# Patient Record
Sex: Female | Born: 1970
Health system: Southern US, Community
[De-identification: ages and names within clinical notes are randomized; demographics above are authoritative.]

## PROBLEM LIST (undated history)

## (undated) DIAGNOSIS — I10 Essential (primary) hypertension: Secondary | ICD-10-CM

## (undated) DIAGNOSIS — D179 Benign lipomatous neoplasm, unspecified: Secondary | ICD-10-CM

## (undated) DIAGNOSIS — B999 Unspecified infectious disease: Secondary | ICD-10-CM

## (undated) DIAGNOSIS — E785 Hyperlipidemia, unspecified: Secondary | ICD-10-CM

## (undated) DIAGNOSIS — T7840XA Allergy, unspecified, initial encounter: Secondary | ICD-10-CM

## (undated) DIAGNOSIS — O009 Unspecified ectopic pregnancy without intrauterine pregnancy: Secondary | ICD-10-CM

## (undated) DIAGNOSIS — D219 Benign neoplasm of connective and other soft tissue, unspecified: Secondary | ICD-10-CM

## (undated) HISTORY — DX: Allergy, unspecified, initial encounter: T78.40XA

## (undated) HISTORY — PX: ABDOMINAL HYSTERECTOMY: SHX81

## (undated) HISTORY — PX: OTHER SURGICAL HISTORY: SHX169

## (undated) HISTORY — DX: Hyperlipidemia, unspecified: E78.5

## (undated) HISTORY — DX: Benign lipomatous neoplasm, unspecified: D17.9

## (undated) HISTORY — PX: ECTOPIC PREGNANCY SURGERY: SHX613

## (undated) HISTORY — PX: ENDOMETRIAL ABLATION: SHX621

## (undated) HISTORY — PX: GYNECOLOGIC CRYOSURGERY: SHX857

## (undated) SURGERY — Surgical Case
Anesthesia: *Unknown

---

## 1988-10-08 HISTORY — PX: ECTOPIC PREGNANCY SURGERY: SHX613

## 1998-09-15 ENCOUNTER — Other Ambulatory Visit: Admission: RE | Admit: 1998-09-15 | Discharge: 1998-09-15 | Payer: Self-pay | Admitting: Obstetrics

## 1998-12-15 ENCOUNTER — Ambulatory Visit (HOSPITAL_COMMUNITY): Admission: RE | Admit: 1998-12-15 | Discharge: 1998-12-15 | Payer: Self-pay | Admitting: Obstetrics

## 1999-04-03 ENCOUNTER — Inpatient Hospital Stay (HOSPITAL_COMMUNITY): Admission: AD | Admit: 1999-04-03 | Discharge: 1999-04-05 | Payer: Self-pay | Admitting: Obstetrics

## 1999-05-24 ENCOUNTER — Ambulatory Visit (HOSPITAL_COMMUNITY): Admission: RE | Admit: 1999-05-24 | Discharge: 1999-05-24 | Payer: Self-pay | Admitting: Obstetrics

## 2002-03-15 ENCOUNTER — Emergency Department (HOSPITAL_COMMUNITY): Admission: EM | Admit: 2002-03-15 | Discharge: 2002-03-16 | Payer: Self-pay | Admitting: *Deleted

## 2002-04-07 ENCOUNTER — Ambulatory Visit (HOSPITAL_BASED_OUTPATIENT_CLINIC_OR_DEPARTMENT_OTHER): Admission: RE | Admit: 2002-04-07 | Discharge: 2002-04-07 | Payer: Self-pay | Admitting: Plastic Surgery

## 2004-07-02 ENCOUNTER — Emergency Department (HOSPITAL_COMMUNITY): Admission: EM | Admit: 2004-07-02 | Discharge: 2004-07-02 | Payer: Self-pay | Admitting: Emergency Medicine

## 2004-07-03 ENCOUNTER — Emergency Department (HOSPITAL_COMMUNITY): Admission: EM | Admit: 2004-07-03 | Discharge: 2004-07-04 | Payer: Self-pay | Admitting: Emergency Medicine

## 2005-01-03 ENCOUNTER — Ambulatory Visit (HOSPITAL_COMMUNITY): Admission: RE | Admit: 2005-01-03 | Discharge: 2005-01-03 | Payer: Self-pay | Admitting: Obstetrics

## 2005-01-11 ENCOUNTER — Encounter: Admission: RE | Admit: 2005-01-11 | Discharge: 2005-01-11 | Payer: Self-pay | Admitting: Obstetrics

## 2005-01-18 ENCOUNTER — Ambulatory Visit: Payer: Self-pay

## 2005-01-27 ENCOUNTER — Encounter: Admission: RE | Admit: 2005-01-27 | Discharge: 2005-01-27 | Payer: Self-pay | Admitting: Neurology

## 2006-04-23 ENCOUNTER — Emergency Department (HOSPITAL_COMMUNITY): Admission: EM | Admit: 2006-04-23 | Discharge: 2006-04-23 | Payer: Self-pay | Admitting: Emergency Medicine

## 2006-07-25 ENCOUNTER — Encounter: Admission: RE | Admit: 2006-07-25 | Discharge: 2006-07-25 | Payer: Self-pay | Admitting: Family Medicine

## 2007-08-15 ENCOUNTER — Encounter: Admission: RE | Admit: 2007-08-15 | Discharge: 2007-08-15 | Payer: Self-pay | Admitting: Neurosurgery

## 2008-02-24 ENCOUNTER — Other Ambulatory Visit: Admission: RE | Admit: 2008-02-24 | Discharge: 2008-02-24 | Payer: Self-pay | Admitting: Family Medicine

## 2008-03-19 ENCOUNTER — Ambulatory Visit (HOSPITAL_BASED_OUTPATIENT_CLINIC_OR_DEPARTMENT_OTHER): Admission: RE | Admit: 2008-03-19 | Discharge: 2008-03-19 | Payer: Self-pay | Admitting: Family Medicine

## 2008-03-27 ENCOUNTER — Ambulatory Visit: Payer: Self-pay | Admitting: Internal Medicine

## 2008-07-02 ENCOUNTER — Encounter: Admission: RE | Admit: 2008-07-02 | Discharge: 2008-07-02 | Payer: Self-pay | Admitting: Family Medicine

## 2008-08-05 ENCOUNTER — Encounter: Admission: RE | Admit: 2008-08-05 | Discharge: 2008-08-05 | Payer: Self-pay | Admitting: Neurosurgery

## 2008-08-27 ENCOUNTER — Ambulatory Visit (HOSPITAL_COMMUNITY): Admission: RE | Admit: 2008-08-27 | Discharge: 2008-08-27 | Payer: Self-pay | Admitting: Obstetrics & Gynecology

## 2008-08-27 ENCOUNTER — Encounter: Payer: Self-pay | Admitting: Obstetrics & Gynecology

## 2008-10-04 ENCOUNTER — Emergency Department (HOSPITAL_COMMUNITY): Admission: EM | Admit: 2008-10-04 | Discharge: 2008-10-04 | Payer: Self-pay | Admitting: Emergency Medicine

## 2009-01-03 ENCOUNTER — Emergency Department (HOSPITAL_COMMUNITY): Admission: EM | Admit: 2009-01-03 | Discharge: 2009-01-03 | Payer: Self-pay | Admitting: Emergency Medicine

## 2009-08-10 ENCOUNTER — Emergency Department (HOSPITAL_COMMUNITY): Admission: EM | Admit: 2009-08-10 | Discharge: 2009-08-10 | Payer: Self-pay | Admitting: Emergency Medicine

## 2009-10-21 ENCOUNTER — Emergency Department (HOSPITAL_COMMUNITY): Admission: EM | Admit: 2009-10-21 | Discharge: 2009-10-21 | Payer: Self-pay | Admitting: Emergency Medicine

## 2009-11-30 ENCOUNTER — Encounter: Admission: RE | Admit: 2009-11-30 | Discharge: 2010-01-16 | Payer: Self-pay | Admitting: Family Medicine

## 2010-07-05 ENCOUNTER — Emergency Department (HOSPITAL_COMMUNITY): Admission: EM | Admit: 2010-07-05 | Discharge: 2010-07-05 | Payer: Self-pay | Admitting: Emergency Medicine

## 2010-10-29 ENCOUNTER — Encounter: Payer: Self-pay | Admitting: Neurology

## 2010-12-06 ENCOUNTER — Emergency Department (HOSPITAL_COMMUNITY)
Admission: EM | Admit: 2010-12-06 | Discharge: 2010-12-06 | Disposition: A | Discharge status: Home / Self Care | Payer: Medicaid Other | Attending: Emergency Medicine | Admitting: Emergency Medicine

## 2010-12-06 ENCOUNTER — Emergency Department (HOSPITAL_COMMUNITY): Payer: Medicaid Other

## 2010-12-06 DIAGNOSIS — R509 Fever, unspecified: Secondary | ICD-10-CM | POA: Insufficient documentation

## 2010-12-06 DIAGNOSIS — R0602 Shortness of breath: Secondary | ICD-10-CM | POA: Insufficient documentation

## 2010-12-06 DIAGNOSIS — J329 Chronic sinusitis, unspecified: Secondary | ICD-10-CM | POA: Insufficient documentation

## 2010-12-06 DIAGNOSIS — R059 Cough, unspecified: Secondary | ICD-10-CM | POA: Insufficient documentation

## 2010-12-06 DIAGNOSIS — R05 Cough: Secondary | ICD-10-CM | POA: Insufficient documentation

## 2010-12-06 DIAGNOSIS — I10 Essential (primary) hypertension: Secondary | ICD-10-CM | POA: Insufficient documentation

## 2010-12-06 LAB — CBC
Hemoglobin: 12.1 g/dL (ref 12.0–15.0)
MCH: 31.8 pg (ref 26.0–34.0)
MCV: 97.9 fL (ref 78.0–100.0)
RBC: 3.8 MIL/uL — ABNORMAL LOW (ref 3.87–5.11)

## 2010-12-06 LAB — DIFFERENTIAL
Lymphocytes Relative: 29 % (ref 12–46)
Lymphs Abs: 2.8 10*3/uL (ref 0.7–4.0)
Monocytes Relative: 8 % (ref 3–12)
Neutro Abs: 5.9 10*3/uL (ref 1.7–7.7)
Neutrophils Relative %: 61 % (ref 43–77)

## 2010-12-06 LAB — BASIC METABOLIC PANEL
CO2: 22 mEq/L (ref 19–32)
Chloride: 108 mEq/L (ref 96–112)
Creatinine, Ser: 0.76 mg/dL (ref 0.4–1.2)
GFR calc Af Amer: >60 mL/min (ref >60)

## 2010-12-06 LAB — HEPATIC FUNCTION PANEL
Albumin: 3.6 g/dL (ref 3.5–5.2)
Alkaline Phosphatase: 85 U/L (ref 39–117)
Total Bilirubin: 0.3 mg/dL (ref 0.3–1.2)

## 2010-12-24 LAB — URINALYSIS, ROUTINE W REFLEX MICROSCOPIC
Bilirubin Urine: NEGATIVE
Glucose, UA: NEGATIVE mg/dL
Ketones, ur: NEGATIVE mg/dL
Leukocytes, UA: NEGATIVE
Protein, ur: 100 mg/dL — AB

## 2010-12-24 LAB — POCT PREGNANCY, URINE: Preg Test, Ur: NEGATIVE

## 2010-12-24 LAB — URINE MICROSCOPIC-ADD ON

## 2011-02-06 ENCOUNTER — Ambulatory Visit: Payer: Medicaid Other | Admitting: *Deleted

## 2011-02-20 NOTE — Op Note (Signed)
Stacy Berry, Stacy Berry             ACCOUNT NO.:  [id number]   MEDICAL RECORD NO.:  [id number]          PATIENT TYPE:  AMB   LOCATION:  SDC                           FACILITY:  [facility]   PHYSICIAN:  Roseanna Rainbow, M.D.DATE OF BIRTH:  [birth year]   DATE OF PROCEDURE:  08/27/2008  DATE OF DISCHARGE:                               OPERATIVE REPORT   PREOPERATIVE DIAGNOSIS:  Small fibroid uterus, menometrorrhagia.   POSTOPERATIVE DIAGNOSIS:  Small fibroid uterus, menometrorrhagia, likely  endometrial polyp.   PROCEDURES:  Dilatation and curettage, diagnostic hysteroscopy, and  NovaSure endometrial ablation.   SURGEON:  Roseanna Rainbow, M.D.   ANESTHESIA:  Laryngeal mask airway, paracervical block.   FINDINGS:  There is a suggestion of 2 submucous myomas one emanating  from the right sidewall and the other emanating from the posterior  fundal region.  Both of these lesions were approximately 2 cm in  diameter.  Arising from the anterior wall above the internal os was a  polypoid lesion approximately 1-2 cm in diameter.  The remainder of the  endometrium appeared thin.  The tubal ostia were well visualized  bilaterally.  After the ablation cycle there was an area involving left  cornu and left sidewall that had not been ablated.   SPECIMENS:  Endometrial curettings.   ESTIMATED BLOOD LOSS:  Minimal.   COMPLICATIONS:  None.   PROCEDURE IN DETAIL:  The patient was taken to the operating room with  an IV running.  A laryngeal mask airway was placed.  She was then placed  in the dorsal lithotomy position and prepped and draped in the usual  sterile fashion.  After a time-out had been completed a bivalve speculum  was placed in the patient's vagina.  The anterior lip of the cervix was  then infiltrated with 1% lidocaine.  The single-tooth tenaculum was then  applied to this location.  A 4 mL was then injected at 4 and 7 o'clock  to produce a paracervical block.  The uterus was  then sounded to 9.5 cm.  A Hegar dilator was placed in the cervix to assess the cervical length  and this length was measured at 3 cm.  The cervix was then dilated with  [facility] dilators.  A diagnostic hysteroscopy was then performed with the  above findings noted.  The hysteroscope was then removed.  A sharp  curettage was then performed.  The above-noted polypoid mass was  retrieved in the curettings.  The NovaSure device was then inserted into  the intrauterine cavity and the device seated.  The intercornual width  was then measured at 4 cm.  The intracavitary integrity test was then  performed and passed.  The ablation cycle was initiated and completed.  The device was then removed.  The hysteroscope was then reintroduced  into the uterus with the above findings noted.  The hysteroscope was  then  removed.  The single-tooth tenaculum was removed with minimal bleeding  noted from the cervix.  At the closure of the procedure the instrument  and pack counts were said to be correct x2.  The patient was  taken to  the PACU awake and in stable condition.      Roseanna Rainbow, M.D.  Electronically Signed     LAJ/MEDQ  D:  08/27/2008  T:  08/28/2008  Job:  [id number]

## 2011-02-20 NOTE — Procedures (Signed)
NAME:  Stacy Berry, Stacy Berry             ACCOUNT NO.:  [id number]   MEDICAL RECORD NO.:  [id number]          PATIENT TYPE:  OUT   LOCATION:  SLEEP CENTER                 FACILITY:  [facility]   PHYSICIAN:  Clinton D. Maple Hudson, MD, FCCP, FACPDATE OF BIRTH:  01-06-69   DATE OF STUDY:  03/19/2008                            NOCTURNAL POLYSOMNOGRAM   REFERRING PHYSICIAN:  Renaye Rakers, M.D.   INDICATION FOR STUDY:  Hypersomnia with sleep apnea.   EPWORTH SLEEPINESS SCORE:  13/24.  BMI 33.5.  Weight 195 pounds.  Height  64 inches.  Neck 16 inches.   MEDICATIONS:  Home medication charted and reviewed.   SLEEP ARCHITECTURE:  Total sleep time 260 minutes with sleep efficiency  64.2%.  Stage I was 9.2%, stage II 71.3%, stage III absent, REM 19.4% of  total sleep time.  Sleep latency 117 minutes, REM latency 38 minutes.  Awake after sleep onset 29.5 minutes.  Arousal index 23.8.  no bedtime  medication taken.   RESPIRATORY DATA:  Apnea-hypopnea index (AHI) 0.7 per hour, which is  normal.  A total of 3 events were scored, all hypopneas, nonpositional.  REM AHI 1.2.  There were insufficient events and sleep to permit CPAP  titration by split protocol on this study night.   OXYGEN DATA:  Minimal snoring noted by technician.  Oxygen desaturation  to a nadir of 91% with mean oxygen saturation 96.3% on room air.   CARDIAC DATA:  Normal sinus rhythm.   MOVEMENT-PARASOMNIA:  No significant movement disturbance.  Bathroom x1.   IMPRESSIONS-RECOMMENDATIONS:  1. Sleep architecture was primarily remarkable for delayed sleep onset      around midnight.  She stated that sleep quality was difficult for      her on a night when she has not been drinking or taken Xanax,      saying that she needs to do one or the other in order to sleep.      Lifestyle counseling may be appropriate.  2. Occasional respiratory event affecting sleep, apnea-hypopnea index      0.7 per hour, which is normal (normal range 0-5 per  hour).      Insignificant snoring with oxygen desaturation to a nadir of 91%.      Clinton D. Maple Hudson, MD, [facility], FACP  Diplomate, Biomedical engineer of Sleep Medicine  Electronically Signed     CDY/MEDQ  D:  03/27/2008 30:86:57  T:  03/27/2008 09:18:20  Job:  [id number]

## 2011-02-23 NOTE — Op Note (Signed)
. [facility]  Patient:    Stacy Berry, Stacy Berry Visit Number: [id number] MRN: [id number]          Service Type: DSU Location: [facility] Attending Physician:  Eloise Levels Dictated by:   Mary A. Contogiannis, M.D. Proc. Date: 04/07/02 Admit Date:  04/07/2002 Discharge Date: 04/07/2002                             Operative Report  PREOPERATIVE DIAGNOSIS:  A 0.9 cm suspicious skin lesion, right anterior lower thigh.  POSTOPERATIVE DIAGNOSIS:  A 0.9 cm suspicious skin lesion, right anterior lower thigh.  PROCEDURES PERFORMED: Excision of a 1.1 cm suspicious skin lesion, right lower anterior thigh.  ASSISTANT SURGEON:  Mary A. Contogiannis, M.D.  ANESTHESIA:  1% lidocaine with epinephrine.  COMPLICATIONS:  None.  INDICATIONS FOR THE PROCEDURE:  The patient is a 40 year old African-American female who presents with a skin lesion that has developed just above her right knee. It is undergoing suspicious clinical changes recently. She would like to have excised. She therefore requested we proceed with the excision at this time.  DESCRIPTION OF THE PROCEDURE:  The patient was brought into the minor procedure room and was placed in the supine position on the operating table. The right anterior lower thigh and knee area were prepped with Betadine and draped in a sterile fashion. The skin and subcutaneous tissues in the area of the skin lesion were injected with 1% lidocaine with epinephrine. After adequate hemostasis and anesthesia had taken effect, the procedure was begun.  Using loupe magnification, the edges of the skin lesion were identified. They actually measured 0.9 cm. At least 1 mm of normal skin was taken for a margin circumferentially around the lesion. The total amount of the excised lesion was 1.1 cm. The skin lesion was thus excised full thickness through the skin and subcutaneous tissue. The specimen was marked at the 12  oclock position and passed off the table and sent for a pathologic section evaluation.  The skin edges were then undermined for easier closure. The dermal layer was closed using 4-0 Monocryl suture. The skin was then closed using a 4-0 Monocryl suture and an intercuticular stitch on the skin. The incision was dressed with Steri-Strips and a light gauze bandage.  There were no complications. The patient tolerated the procedure well. She was then taught proper wound care and discharged to home in stable condition. Followup appointment will be tomorrow in the office. Dictated by:   Mary A. Contogiannis, M.D. Attending Physician:  Eloise Levels DD:  04/07/02 TD:  04/09/02 Job: [id number]

## 2011-07-10 LAB — BASIC METABOLIC PANEL
CO2: 27
Calcium: 8.9
Chloride: 100
Creatinine, Ser: 0.71
Glucose, Bld: 96
Sodium: 137

## 2011-07-10 LAB — CBC
Hemoglobin: 10.4 — ABNORMAL LOW
MCHC: 33.3
MCV: 90.5
RBC: 3.46 — ABNORMAL LOW
WBC: 9.4

## 2011-07-12 ENCOUNTER — Emergency Department (HOSPITAL_COMMUNITY)
Admission: EM | Admit: 2011-07-12 | Discharge: 2011-07-13 | Disposition: A | Discharge status: Home / Self Care | Payer: No Typology Code available for payment source | Attending: Emergency Medicine | Admitting: Emergency Medicine

## 2011-07-12 DIAGNOSIS — M545 Low back pain, unspecified: Secondary | ICD-10-CM | POA: Insufficient documentation

## 2011-07-12 DIAGNOSIS — M542 Cervicalgia: Secondary | ICD-10-CM | POA: Insufficient documentation

## 2011-07-12 DIAGNOSIS — M79609 Pain in unspecified limb: Secondary | ICD-10-CM | POA: Insufficient documentation

## 2011-07-12 DIAGNOSIS — I10 Essential (primary) hypertension: Secondary | ICD-10-CM | POA: Insufficient documentation

## 2011-07-12 DIAGNOSIS — M25519 Pain in unspecified shoulder: Secondary | ICD-10-CM | POA: Insufficient documentation

## 2011-07-12 DIAGNOSIS — R51 Headache: Secondary | ICD-10-CM | POA: Insufficient documentation

## 2011-07-12 DIAGNOSIS — M549 Dorsalgia, unspecified: Secondary | ICD-10-CM | POA: Insufficient documentation

## 2011-07-13 ENCOUNTER — Emergency Department (HOSPITAL_COMMUNITY): Payer: No Typology Code available for payment source

## 2011-07-13 ENCOUNTER — Encounter (HOSPITAL_COMMUNITY): Payer: Self-pay

## 2011-07-13 LAB — URINALYSIS, ROUTINE W REFLEX MICROSCOPIC
Bilirubin Urine: NEGATIVE
Glucose, UA: NEGATIVE mg/dL
Nitrite: NEGATIVE
Specific Gravity, Urine: 1.008 (ref 1.005–1.030)
pH: 6 (ref 5.0–8.0)

## 2011-07-13 LAB — GC/CHLAMYDIA PROBE AMP, GENITAL
Chlamydia, DNA Probe: NEGATIVE
GC Probe Amp, Genital: NEGATIVE

## 2011-07-25 ENCOUNTER — Ambulatory Visit
Admission: RE | Admit: 2011-07-25 | Discharge: 2011-07-25 | Disposition: A | Discharge status: Home / Self Care | Payer: No Typology Code available for payment source | Source: Ambulatory Visit | Attending: Family Medicine | Admitting: Family Medicine

## 2011-07-25 ENCOUNTER — Other Ambulatory Visit: Payer: Self-pay | Admitting: Family Medicine

## 2011-07-25 DIAGNOSIS — R220 Localized swelling, mass and lump, head: Secondary | ICD-10-CM

## 2012-05-14 ENCOUNTER — Emergency Department (HOSPITAL_COMMUNITY)
Admission: EM | Admit: 2012-05-14 | Discharge: 2012-05-14 | Disposition: A | Discharge status: Home / Self Care | Payer: 59 | Attending: Emergency Medicine | Admitting: Emergency Medicine

## 2012-05-14 ENCOUNTER — Emergency Department (HOSPITAL_COMMUNITY): Payer: 59

## 2012-05-14 ENCOUNTER — Encounter (HOSPITAL_COMMUNITY): Payer: Self-pay | Admitting: Emergency Medicine

## 2012-05-14 DIAGNOSIS — M79609 Pain in unspecified limb: Secondary | ICD-10-CM | POA: Insufficient documentation

## 2012-05-14 DIAGNOSIS — M79646 Pain in unspecified finger(s): Secondary | ICD-10-CM

## 2012-05-14 HISTORY — DX: Essential (primary) hypertension: I10

## 2012-05-14 LAB — CBC WITH DIFFERENTIAL/PLATELET
Basophils Absolute: 0 10*3/uL (ref 0.0–0.1)
Basophils Relative: 0 % (ref 0–1)
Lymphocytes Relative: 22 % (ref 12–46)
MCHC: 34.1 g/dL (ref 30.0–36.0)
Neutro Abs: 6.2 10*3/uL (ref 1.7–7.7)
Neutrophils Relative %: 70 % (ref 43–77)
RDW: 13.4 % (ref 11.5–15.5)
WBC: 8.9 10*3/uL (ref 4.0–10.5)

## 2012-05-14 LAB — BASIC METABOLIC PANEL
Chloride: 104 mEq/L (ref 96–112)
GFR calc Af Amer: >90 mL/min (ref >90)
Potassium: 4.7 mEq/L (ref 3.5–5.1)
Sodium: 138 mEq/L (ref 135–145)

## 2012-05-14 LAB — SEDIMENTATION RATE: Sed Rate: 13 mm/hr (ref 0–22)

## 2012-05-14 MED ORDER — HYDROCODONE-ACETAMINOPHEN 5-325 MG PO TABS
1.0000 | ORAL_TABLET | Freq: Once | ORAL | Status: AC
  Administered 2012-05-14: 1 via ORAL
  Filled 2012-05-14: qty 1

## 2012-05-14 MED ORDER — CEFAZOLIN SODIUM 1-5 GM-% IV SOLN
1.0000 g | Freq: Once | INTRAVENOUS | Status: AC
  Administered 2012-05-14: 1 g via INTRAVENOUS
  Filled 2012-05-14: qty 50

## 2012-05-14 MED ORDER — IBUPROFEN 800 MG PO TABS: 800.0000 mg | ORAL_TABLET | Freq: Two times a day (BID) | ORAL | Status: AC

## 2012-05-14 MED ORDER — ONDANSETRON HCL 4 MG/2ML IJ SOLN
4.0000 mg | Freq: Once | INTRAMUSCULAR | Status: AC
  Administered 2012-05-14: 4 mg via INTRAVENOUS
  Filled 2012-05-14: qty 2

## 2012-05-14 MED ORDER — HYDROMORPHONE HCL PF 1 MG/ML IJ SOLN
1.0000 mg | Freq: Once | INTRAMUSCULAR | Status: AC
  Administered 2012-05-14: 1 mg via INTRAVENOUS
  Filled 2012-05-14: qty 1

## 2012-05-14 MED ORDER — HYDROCODONE-ACETAMINOPHEN 5-325 MG PO TABS: 1.0000 | ORAL_TABLET | ORAL | Status: AC | PRN

## 2012-05-14 NOTE — ED Notes (Signed)
PA AWARE OF VITALS. PT WILL FOLLOW UP WITH HER PMD AND GET PUT BACK ON THE BP MED THAT SHE TOOK HERSELF OFF OF

## 2012-05-14 NOTE — ED Notes (Signed)
Pt c/o right finger pain with swelling and pain starting yesterday; pt denies obvious injury

## 2012-05-14 NOTE — ED Provider Notes (Signed)
Dr. Ophelia Charter has seen and evaluated the patient. Feels she can be discharged - see his note in patient chart. Instructions: f/u with him in office tomorrow for recheck. Finger splinted, pain medications given.   Rodena Medin, PA-C 05/14/12 [phone]

## 2012-05-14 NOTE — Progress Notes (Signed)
Orthopedic Tech Progress Note Patient Details:  Stacy Berry 1971/01/29 [id number]  Ortho Devices Type of Ortho Device: Finger splint Ortho Device/Splint Interventions: Application   Cammer, Mickie Bail 05/14/2012, 3:04 PM

## 2012-05-14 NOTE — ED Notes (Signed)
Pt waiting for a ride home 

## 2012-05-14 NOTE — Consult Note (Signed)
Reason for Consult:question tendon sheath infection Referring Physician: [name] is an 41 y.o. female.  HPI: onset yesterday while typing with increasing index finger pain and swelling and pain over volar MCP joint region.   No chills, no fever.   Past Medical History  Diagnosis Date  . Hypertension     History reviewed. No pertinent past surgical history.  History reviewed. No pertinent family history.  Social History:  does not have a smoking history on file. She does not have any smokeless tobacco history on file. Her alcohol and drug histories not on file.  Allergies: No Known Allergies  Medications: I have reviewed the patient's current medications.  Results for orders placed during the hospital encounter of 05/14/12 (from the past 48 hour(s))  CBC WITH DIFFERENTIAL     Status: Normal   Collection Time   05/14/12 12:33 PM      Component Value Range Comment   WBC 8.9  4.0 - 10.5 K/uL    RBC 3.88  3.87 - 5.11 MIL/uL    Hemoglobin 12.5  12.0 - 15.0 g/dL    HCT 78.2  95.6 - 21.3 %    MCV 94.6  78.0 - 100.0 fL    MCH 32.2  26.0 - 34.0 pg    MCHC 34.1  30.0 - 36.0 g/dL    RDW 08.6  57.8 - 46.9 %    Platelets 351  150 - 400 K/uL    Neutrophils Relative 70  43 - 77 %    Neutro Abs 6.2  1.7 - 7.7 K/uL    Lymphocytes Relative 22  12 - 46 %    Lymphs Abs 2.0  0.7 - 4.0 K/uL    Monocytes Relative 5  3 - 12 %    Monocytes Absolute 0.5  0.1 - 1.0 K/uL    Eosinophils Relative 2  0 - 5 %    Eosinophils Absolute 0.2  0.0 - 0.7 K/uL    Basophils Relative 0  0 - 1 %    Basophils Absolute 0.0  0.0 - 0.1 K/uL   BASIC METABOLIC PANEL     Status: Abnormal   Collection Time   05/14/12 12:33 PM      Component Value Range Comment   Sodium 138  135 - 145 mEq/L    Potassium 4.7  3.5 - 5.1 mEq/L HEMOLYSIS AT THIS LEVEL MAY AFFECT RESULT   Chloride 104  96 - 112 mEq/L    CO2 25  19 - 32 mEq/L    Glucose, Bld 112 (*) 70 - 99 mg/dL    BUN 7  6 - 23 mg/dL    Creatinine, Ser  6.29  0.50 - 1.10 mg/dL    Calcium 9.1  8.4 - 52.8 mg/dL    GFR calc non Af Amer 90 (*) >90 mL/min    GFR calc Af Amer >90  >90 mL/min   SEDIMENTATION RATE     Status: Normal   Collection Time   05/14/12 12:33 PM      Component Value Range Comment   Sed Rate 13  0 - 22 mm/hr     Dg Hand Complete Right  05/14/2012  *RADIOLOGY REPORT*  Clinical Data: Finger swelling  RIGHT HAND - COMPLETE 3+ VIEW  Comparison: 08/10/2009  Findings: Moderate soft tissue swelling of the proximal index finger without underlying fracture or destructive bone lesion.  No dislocation.  Joint spaces are maintained.  IMPRESSION: Soft tissue swelling of the index  finger without underlying acute bony pathology.  Original Report Authenticated By: Donavan Burnet, M.D.    Review of Systems  Constitutional: Negative for fever and chills.  HENT: Negative.   Eyes: Negative.   Respiratory: Negative.   Cardiovascular: Negative.   Gastrointestinal: Negative.   Genitourinary: Negative.   Skin: Negative.   Neurological: Negative.    Blood pressure 163/120, pulse 84, temperature 98.1 F (36.7 C), temperature source Oral, resp. rate 20, last menstrual period 05/10/2012, SpO2 97.00%. Physical Exam  Constitutional: She appears well-developed.  HENT:  Head: Normocephalic.  Eyes: Pupils are equal, round, and reactive to light.  Neck: Normal range of motion.  Cardiovascular: Normal rate.   Respiratory: Effort normal.  GI: Soft.  Musculoskeletal:       Pain with palpation over A1 pulley . Minimal tenderness over A2 and all other pulleys. Pain with flexion and with full extension pain over A1 pulley.   Sensation intact. Carpal tunnel exam is normal. Extensor function , PIP, DIP, MCP all normal to palpation without effusion and no tenderness.     Assessment/Plan:   Index finger pain with likely Trigger finger.  Exam not consistant with tendon sheath infection.  I will check her in my office tomorrow afternoon for followup.     [phone].    vicodin for pain and motrin 800mg  bid for inflammation.     [name] 05/14/2012, 2:57 PM

## 2012-05-14 NOTE — ED Notes (Signed)
Dr yates at bedside 

## 2012-05-14 NOTE — ED Provider Notes (Signed)
History  This chart was scribed for Dione Booze, MD by Shari Heritage. The patient was seen in room TR08C/TR08C. Patient's care was started at 1158.     CSN: [id number]  Arrival date & time 05/14/12  1158   None     Chief Complaint  Patient presents with  . Finger Injury     The history is provided by the patient. No language interpreter was used.    Stacy Berry is a 41 y.o. female who presents to the Emergency Department complaining of moderate to severe, 2nd right finger pain and swelling onset 1 day ago. Patient states that swelling began while typing yesterday and when she woke up this morning, swelling had worsened significantly. Patient rates pain as 10/10. She denies any obvious injury. Patient says that she stopped taking her blood pressure medication 3 months ago, but she has been checking her blood pressure regularly. She reports no other significant past medical, surgical or family history.  PCP - Parke Simmers  History  Substance Use Topics  . Smoking status: Not on file  . Smokeless tobacco: Not on file  . Alcohol Use: Not on file    OB History    Grav Para Term Preterm Abortions TAB SAB Ect Mult Living                  Review of Systems  All other systems reviewed and are negative.    Allergies  Review of patient's allergies indicates no known allergies.  Home Medications  No current outpatient prescriptions on file.  BP 179/105  Pulse 106  Temp 98.4 F (36.9 C) (Oral)  Resp 16  SpO2 100%  Physical Exam  Constitutional: She is oriented to person, place, and time. She appears well-developed and well-nourished.  HENT:  Head: Normocephalic and atraumatic.  Musculoskeletal:       1+ pretibial edema. Right hand has moderate swelling of the right second finger. Mild swelling of the right 3rd finger. Swelling extends into the palmar area. Moderate tenderness on the flexor surface of the right 2nd finger with mild erythema. Pain elicited with passive  extension of the finger and active flexion. Neurovascularly intact.  Neurological: She is alert and oriented to person, place, and time.  Psychiatric: She has a normal mood and affect. Her behavior is normal.    ED Course  Procedures (including critical care time) DIAGNOSTIC STUDIES: Oxygen Saturation is 100% on room air, normal by my interpretation.    COORDINATION OF CARE: 12:17pm- Patient informed of current plan for treatment and evaluation and agrees with plan at this time. Will administer Ancef by IV. Will order an X-ray of right hand, CBC, basic metabolic panel and sedimentation rate test.  1:14pm- X-ray of right hand is negative. Performed consult with on call hand specialist, Dr Ophelia Charter, requesting that patient be evaluated in the CDU. Dr. Ophelia Charter has agreed to see patient.  Results for orders placed during the hospital encounter of 05/14/12  CBC WITH DIFFERENTIAL      Component Value Range   WBC 8.9  4.0 - 10.5 K/uL   RBC 3.88  3.87 - 5.11 MIL/uL   Hemoglobin 12.5  12.0 - 15.0 g/dL   HCT 40.9  81.1 - 91.4 %   MCV 94.6  78.0 - 100.0 fL   MCH 32.2  26.0 - 34.0 pg   MCHC 34.1  30.0 - 36.0 g/dL   RDW 78.2  95.6 - 21.3 %   Platelets 351  150 - 400  K/uL   Neutrophils Relative 70  43 - 77 %   Neutro Abs 6.2  1.7 - 7.7 K/uL   Lymphocytes Relative 22  12 - 46 %   Lymphs Abs 2.0  0.7 - 4.0 K/uL   Monocytes Relative 5  3 - 12 %   Monocytes Absolute 0.5  0.1 - 1.0 K/uL   Eosinophils Relative 2  0 - 5 %   Eosinophils Absolute 0.2  0.0 - 0.7 K/uL   Basophils Relative 0  0 - 1 %   Basophils Absolute 0.0  0.0 - 0.1 K/uL  BASIC METABOLIC PANEL      Component Value Range   Sodium 138  135 - 145 mEq/L   Potassium 4.7  3.5 - 5.1 mEq/L   Chloride 104  96 - 112 mEq/L   CO2 25  19 - 32 mEq/L   Glucose, Bld 112 (*) 70 - 99 mg/dL   BUN 7  6 - 23 mg/dL   Creatinine, Ser 2.13  0.50 - 1.10 mg/dL   Calcium 9.1  8.4 - 08.6 mg/dL   GFR calc non Af Amer 90 (*) >90 mL/min   GFR calc Af Amer >90   >90 mL/min  SEDIMENTATION RATE      Component Value Range   Sed Rate 13  0 - 22 mm/hr   Dg Hand Complete Right  05/14/2012  *RADIOLOGY REPORT*  Clinical Data: Finger swelling  RIGHT HAND - COMPLETE 3+ VIEW  Comparison: 08/10/2009  Findings: Moderate soft tissue swelling of the proximal index finger without underlying fracture or destructive bone lesion.  No dislocation.  Joint spaces are maintained.  IMPRESSION: Soft tissue swelling of the index finger without underlying acute bony pathology.  Original Report Authenticated By: Donavan Burnet, M.D.     1. Finger pain       MDM  Pain and swelling in the right second finger, pain with tension applied to the flexor tendon-concern for flexor June of cellulitis. Consultation is obtained with Dr. Kevan Ny who is on call for hand surgery who will come and evaluate the patient. Of intravenous cefazolin given. She was given hydromorphone for pain.      I personally performed the services described in this documentation, which was scribed in my presence. The recorded information has been reviewed and considered.      Dione Booze, MD 05/14/12 [phone]

## 2012-05-14 NOTE — ED Provider Notes (Signed)
Medical screening examination/treatment/procedure(s) were conducted as a shared visit with non-physician practitioner(s) and myself.  I personally evaluated the patient during the encounter   Stacy Booze, MD 05/14/12 [phone]

## 2012-09-03 ENCOUNTER — Encounter (INDEPENDENT_AMBULATORY_CARE_PROVIDER_SITE_OTHER): Payer: Self-pay | Admitting: General Surgery

## 2012-09-03 ENCOUNTER — Ambulatory Visit (INDEPENDENT_AMBULATORY_CARE_PROVIDER_SITE_OTHER): Payer: Commercial Managed Care - PPO | Admitting: General Surgery

## 2012-09-03 VITALS — BP 142/80 | HR 92 | Temp 97.6°F | Resp 16 | Ht 65.0 in | Wt 193.4 lb

## 2012-09-03 DIAGNOSIS — D1739 Benign lipomatous neoplasm of skin and subcutaneous tissue of other sites: Secondary | ICD-10-CM

## 2012-09-03 DIAGNOSIS — D171 Benign lipomatous neoplasm of skin and subcutaneous tissue of trunk: Secondary | ICD-10-CM | POA: Insufficient documentation

## 2012-09-03 NOTE — Patient Instructions (Signed)
Your examination feels most consistent with a lipoma.  If your mass gets significantly bigger or causes you more symptoms, we can remove it.

## 2012-09-03 NOTE — Progress Notes (Signed)
Chief Complaint  Patient presents with  . New Evaluation    eval of lipoma rt side of abdomen    HISTORY:  Stacy Berry is a 41 y.o. female who presents to clinic with R sided mass.  She has noticed it for the past few months.  It causes her some pain from time to time.  This is relieved with Ibuprofen.  She doesn't think it is getting bigger.  Past Medical History  Diagnosis Date  . Hypertension   . Hyperlipidemia        Past Surgical History  Procedure Date  . Ectopic pregnancy surgery   . Endometrial ablation       Current Outpatient Prescriptions  Medication Sig Dispense Refill  . Cyanocobalamin (VITAMIN B-12 PO) Take 1 tablet by mouth daily.      . Mefenamic Acid 250 MG CAPS Take 250 mg by mouth as needed.      . Multiple Vitamin (MULTIVITAMIN WITH MINERALS) TABS Take 1 tablet by mouth daily.      . naproxen sodium (ANAPROX) 220 MG tablet Take 220 mg by mouth 3 (three) times daily as needed. As needed for finger pain.      Marland Kitchen POTASSIUM PO Take 1 tablet by mouth daily.      . valsartan-hydrochlorothiazide (DIOVAN-HCT) 80-12.5 MG per tablet Take 1 tablet by mouth daily.         No Known Allergies    Family History  Problem Relation Age of Onset  . Cancer Maternal Grandmother     breast      History   Social History  . Marital Status: Single    Spouse Name: N/A    Number of Children: N/A  . Years of Education: N/A   Social History Main Topics  . Smoking status: Never Smoker   . Smokeless tobacco: Never Used  . Alcohol Use: Yes     Comment: occ  . Drug Use: No  . Sexually Active: None   Other Topics Concern  . None   Social History Narrative  . None       REVIEW OF SYSTEMS - PERTINENT POSITIVES ONLY: Review of Systems - General ROS: negative for - chills or fever Respiratory ROS: no cough, shortness of breath, or wheezing Cardiovascular ROS: no chest pain or dyspnea on exertion Gastrointestinal ROS: no abdominal pain, change in bowel habits, or  black or bloody stools  EXAM: Filed Vitals:   09/03/12 1533  BP: 142/80  Pulse: 92  Temp: 97.6 F (36.4 C)  Resp: 16    General appearance: alert and cooperative GI: soft, non-tender; bowel sounds normal; no masses,  no organomegaly benign feeling mass on R hip ~6-8 cm by 4-5 cm, freely mobile, above fascia   ASSESSMENT AND PLAN:  [name] 41 y.o. F who was referred to me for an abdominal wall mass.  On exam, it feels most consistent with a lipoma.  We discussed that the very small malignant potential of this.  She has elected to watch the area for now.  She will call the office if her pain gets worse or the area enlarges.   Vanita Panda, MD Colon and Rectal Surgery / General Surgery [facility]      Visit Diagnoses: 1. Lipoma of abdominal wall     Primary Care Physician: Provider Not In System

## 2012-11-28 ENCOUNTER — Other Ambulatory Visit (HOSPITAL_COMMUNITY): Payer: Self-pay | Admitting: Obstetrics & Gynecology

## 2012-11-28 DIAGNOSIS — D259 Leiomyoma of uterus, unspecified: Secondary | ICD-10-CM

## 2012-11-28 DIAGNOSIS — N926 Irregular menstruation, unspecified: Secondary | ICD-10-CM

## 2012-12-03 ENCOUNTER — Ambulatory Visit (HOSPITAL_COMMUNITY): Payer: 59 | Attending: Obstetrics & Gynecology

## 2013-04-01 ENCOUNTER — Encounter: Payer: Self-pay | Admitting: Obstetrics

## 2013-04-29 ENCOUNTER — Encounter (HOSPITAL_COMMUNITY): Payer: Self-pay | Admitting: *Deleted

## 2013-04-29 ENCOUNTER — Inpatient Hospital Stay (HOSPITAL_COMMUNITY)
Admission: AD | Admit: 2013-04-29 | Discharge: 2013-04-29 | Disposition: A | Discharge status: Home / Self Care | Payer: 59 | Source: Ambulatory Visit | Attending: Obstetrics & Gynecology | Admitting: Obstetrics & Gynecology

## 2013-04-29 ENCOUNTER — Inpatient Hospital Stay (HOSPITAL_COMMUNITY): Payer: 59

## 2013-04-29 DIAGNOSIS — D219 Benign neoplasm of connective and other soft tissue, unspecified: Secondary | ICD-10-CM

## 2013-04-29 DIAGNOSIS — N938 Other specified abnormal uterine and vaginal bleeding: Secondary | ICD-10-CM | POA: Insufficient documentation

## 2013-04-29 DIAGNOSIS — I10 Essential (primary) hypertension: Secondary | ICD-10-CM

## 2013-04-29 DIAGNOSIS — N898 Other specified noninflammatory disorders of vagina: Secondary | ICD-10-CM

## 2013-04-29 DIAGNOSIS — D259 Leiomyoma of uterus, unspecified: Secondary | ICD-10-CM | POA: Insufficient documentation

## 2013-04-29 DIAGNOSIS — R109 Unspecified abdominal pain: Secondary | ICD-10-CM

## 2013-04-29 DIAGNOSIS — N939 Abnormal uterine and vaginal bleeding, unspecified: Secondary | ICD-10-CM

## 2013-04-29 DIAGNOSIS — N949 Unspecified condition associated with female genital organs and menstrual cycle: Secondary | ICD-10-CM | POA: Insufficient documentation

## 2013-04-29 HISTORY — DX: Benign neoplasm of connective and other soft tissue, unspecified: D21.9

## 2013-04-29 HISTORY — DX: Unspecified infectious disease: B99.9

## 2013-04-29 LAB — WET PREP, GENITAL

## 2013-04-29 LAB — COMPREHENSIVE METABOLIC PANEL
ALT: 12 U/L (ref 0–35)
AST: 15 U/L (ref 0–37)
CO2: 25 mEq/L (ref 19–32)
Calcium: 9.3 mg/dL (ref 8.4–10.5)
Chloride: 102 mEq/L (ref 96–112)
Creatinine, Ser: 0.73 mg/dL (ref 0.50–1.10)
GFR calc Af Amer: >90 mL/min (ref >90)
GFR calc non Af Amer: >90 mL/min (ref >90)
Glucose, Bld: 94 mg/dL (ref 70–99)
Sodium: 139 mEq/L (ref 135–145)
Total Bilirubin: 0.3 mg/dL (ref 0.3–1.2)

## 2013-04-29 LAB — CBC WITH DIFFERENTIAL/PLATELET
Basophils Absolute: 0 10*3/uL (ref 0.0–0.1)
Eosinophils Relative: 1 % (ref 0–5)
HCT: 37.1 % (ref 36.0–46.0)
Lymphocytes Relative: 34 % (ref 12–46)
Lymphs Abs: 3 10*3/uL (ref 0.7–4.0)
MCV: 94.6 fL (ref 78.0–100.0)
Monocytes Absolute: 0.7 10*3/uL (ref 0.1–1.0)
Neutro Abs: 5 10*3/uL (ref 1.7–7.7)
RBC: 3.92 MIL/uL (ref 3.87–5.11)
WBC: 8.8 10*3/uL (ref 4.0–10.5)

## 2013-04-29 LAB — URINE MICROSCOPIC-ADD ON

## 2013-04-29 LAB — URINALYSIS, ROUTINE W REFLEX MICROSCOPIC
Bilirubin Urine: NEGATIVE
Leukocytes, UA: NEGATIVE
Nitrite: NEGATIVE
Specific Gravity, Urine: >1.03 — ABNORMAL HIGH (ref 1.005–1.030)
Urobilinogen, UA: 0.2 mg/dL (ref 0.0–1.0)

## 2013-04-29 LAB — POCT PREGNANCY, URINE: Preg Test, Ur: NEGATIVE

## 2013-04-29 MED ORDER — KETOROLAC TROMETHAMINE 60 MG/2ML IM SOLN
60.0000 mg | Freq: Once | INTRAMUSCULAR | Status: AC
  Administered 2013-04-29: 60 mg via INTRAMUSCULAR
  Filled 2013-04-29: qty 2

## 2013-04-29 NOTE — MAU Note (Signed)
Patient states she has a history of fibroids. States she had a period on 6-28 for her normal 7 days then started bleeding again on 7-21. Starts cramping about one week before bleeding. States she has a lump on the right side at the waist level.

## 2013-04-29 NOTE — MAU Provider Note (Signed)
History     CSN: [id number]  Arrival date and time: 04/29/13 1715   First Provider Initiated Contact with Patient 04/29/13 1814      Chief Complaint  Patient presents with  . Vaginal Bleeding  . Abdominal Pain   HPI  Stacy Berry is 42 y.o. [phone] presents with abnormal vaginal bleeding and pain. Has had nausea and less appetite today. The vaginal bleeding is coming more often and lasting longer with large clots.  She is a patient of Dr. Marcia Brash.  She called the office today and states they told her to come here for an ultrasound.  She has known fibroids, had endometrial ablation at [facility] 2 years ago and one with Dr. Tamela Oddi.  States she wants her to have a hysterectomy.  She also has a lipoma on her right side with pain.  She has seen a Careers adviser for evaluation.  Rated 9/10 at the worst and now it is 8/10.  States the pain now is going into her back.  Denies UTI sxs.  She took 800mg  ibuprofen at 8am and again at noon.  She has elevated blood pressures here--states she used to be on medication but wanted to change doctors so she ran out of medication.  Patient also reported to the RN that her partner is acting funny and she is requesting cultures for Gc/CHl.     Past Medical History  Diagnosis Date  . Hypertension   . Hyperlipidemia   . Infection     UTI  . Fibroid     Past Surgical History  Procedure Laterality Date  . Ectopic pregnancy surgery    . Endometrial ablation      Family History  Problem Relation Age of Onset  . Cancer Maternal Grandmother     breast  . Cancer Paternal Grandfather     History  Substance Use Topics  . Smoking status: Current Some Day Smoker  . Smokeless tobacco: Never Used  . Alcohol Use: Yes     Comment: occ    Allergies: No Known Allergies  Prescriptions prior to admission  Medication Sig Dispense Refill  . ibuprofen (ADVIL,MOTRIN) 800 MG tablet Take 800-1,600 mg by mouth every 8 (eight) hours as needed for pain.       . Prenatal Multivit-Min-Fe-FA (PRENATAL/IRON PO) Take by mouth.      . valsartan-hydrochlorothiazide (DIOVAN-HCT) 80-12.5 MG per tablet Take 1 tablet by mouth daily.        Review of Systems  Constitutional: Negative for fever and chills.  Gastrointestinal: Positive for nausea and abdominal pain (cramping). Negative for vomiting.  Genitourinary:       +vaginal bleeding with clots  Musculoskeletal: Positive for back pain (lower).  Neurological: Negative for headaches.   Physical Exam   Blood pressure 160/102, pulse 77, temperature 99.1 F (37.3 C), temperature source Oral, resp. rate 18, height 5' 4.5" (1.638 m), weight 191 lb 12.8 oz (87 kg), last menstrual period 04/27/2013, SpO2 100.00%.  Physical Exam  Vitals reviewed. Constitutional: She is oriented to person, place, and time. She appears well-developed and well-nourished. No distress.  HENT:  Head: Normocephalic.  Neck: Normal range of motion.  Cardiovascular: Normal rate.   Elevated blood pressure  Respiratory: Effort normal.  GI: Soft. She exhibits no distension and no mass. There is no tenderness. There is no rebound and no guarding.  There is a tender soft mass on her right lower side.  Without redness.    Genitourinary: There is no rash, tenderness or  lesion on the right labia. There is no rash, tenderness or lesion on the left labia. Uterus is enlarged and tender. Cervix exhibits no discharge and no friability. Right adnexum displays tenderness and fullness. Left adnexum displays tenderness. Left adnexum displays no fullness. There is bleeding (moderate amount of stringy blood without clots) around the vagina. No erythema or tenderness around the vagina. No vaginal discharge found.  Neurological: She is alert and oriented to person, place, and time.  Skin: Skin is warm and dry.  Psychiatric: She has a normal mood and affect. Her behavior is normal.   Results for orders placed during the hospital encounter of 04/29/13  (from the past 24 hour(s))  URINALYSIS, ROUTINE W REFLEX MICROSCOPIC     Status: Abnormal   Collection Time    04/29/13  5:35 PM      Result Value Range   Color, Urine YELLOW  YELLOW   APPearance CLEAR  CLEAR   Specific Gravity, Urine >1.030 (*) 1.005 - 1.030   pH 6.0  5.0 - 8.0   Glucose, UA NEGATIVE  NEGATIVE mg/dL   Hgb urine dipstick MODERATE (*) NEGATIVE   Bilirubin Urine NEGATIVE  NEGATIVE   Ketones, ur 40 (*) NEGATIVE mg/dL   Protein, ur NEGATIVE  NEGATIVE mg/dL   Urobilinogen, UA 0.2  0.0 - 1.0 mg/dL   Nitrite NEGATIVE  NEGATIVE   Leukocytes, UA NEGATIVE  NEGATIVE  URINE MICROSCOPIC-ADD ON     Status: Abnormal   Collection Time    04/29/13  5:35 PM      Result Value Range   Squamous Epithelial / LPF FEW (*) RARE   WBC, UA 0-2  <3 WBC/hpf   RBC / HPF 3-6  <3 RBC/hpf   Urine-Other MUCOUS PRESENT    POCT PREGNANCY, URINE     Status: None   Collection Time    04/29/13  5:47 PM      Result Value Range   Preg Test, Ur NEGATIVE  NEGATIVE  WET PREP, GENITAL     Status: Abnormal   Collection Time    04/29/13  6:40 PM      Result Value Range   Yeast Wet Prep HPF POC NONE SEEN  NONE SEEN   Trich, Wet Prep NONE SEEN  NONE SEEN   Clue Cells Wet Prep HPF POC FEW (*) NONE SEEN   WBC, Wet Prep HPF POC FEW (*) NONE SEEN  CBC WITH DIFFERENTIAL     Status: None   Collection Time    04/29/13  6:49 PM      Result Value Range   WBC 8.8  4.0 - 10.5 K/uL   RBC 3.92  3.87 - 5.11 MIL/uL   Hemoglobin 13.1  12.0 - 15.0 g/dL   HCT 09.8  11.9 - 14.7 %   MCV 94.6  78.0 - 100.0 fL   MCH 33.4  26.0 - 34.0 pg   MCHC 35.3  30.0 - 36.0 g/dL   RDW 82.9  56.2 - 13.0 %   Platelets 321  150 - 400 K/uL   Neutrophils Relative % 56  43 - 77 %   Neutro Abs 5.0  1.7 - 7.7 K/uL   Lymphocytes Relative 34  12 - 46 %   Lymphs Abs 3.0  0.7 - 4.0 K/uL   Monocytes Relative 8  3 - 12 %   Monocytes Absolute 0.7  0.1 - 1.0 K/uL   Eosinophils Relative 1  0 - 5 %   Eosinophils Absolute 0.1  0.0 - 0.7 K/uL    Basophils Relative 0  0 - 1 %   Basophils Absolute 0.0  0.0 - 0.1 K/uL  COMPREHENSIVE METABOLIC PANEL     Status: Abnormal   Collection Time    04/29/13  6:49 PM      Result Value Range   Sodium 139  135 - 145 mEq/L   Potassium 3.3 (*) 3.5 - 5.1 mEq/L   Chloride 102  96 - 112 mEq/L   CO2 25  19 - 32 mEq/L   Glucose, Bld 94  70 - 99 mg/dL   BUN 6  6 - 23 mg/dL   Creatinine, Ser 5.40  0.50 - 1.10 mg/dL   Calcium 9.3  8.4 - 98.1 mg/dL   Total Protein 6.7  6.0 - 8.3 g/dL   Albumin 4.0  3.5 - 5.2 g/dL   AST 15  0 - 37 U/L   ALT 12  0 - 35 U/L   Alkaline Phosphatase 86  39 - 117 U/L   Total Bilirubin 0.3  0.3 - 1.2 mg/dL   GFR calc non Af Amer >90  >90 mL/min   GFR calc Af Amer >90  >90 mL/min   Clinical Data: Pelvic pain, fibroids  TRANSABDOMINAL AND TRANSVAGINAL ULTRASOUND OF PELVIS  Technique: Both transabdominal and transvaginal ultrasound  examinations of the pelvis were performed. Transabdominal  technique was performed for global imaging of the pelvis including  uterus, ovaries, adnexal regions, and pelvic cul-de-sac.  It was necessary to proceed with endovaginal exam following the  transabdominal exam to visualize the endometrium and ovaries.  Comparison: 10/04/2008 similar exam  Findings:  Uterus: 9.2 x 6.9 x 5.9 cm. Anteverted, anteflexed. Multiple  fibroids are noted as follows:  Right fundal subserosal / intramural, 3.3 x 2.9 x 2.9 cm.  Posterior uterine body, intramural, 2.5 x 2.1 x 1.6 cm  At least two fibroids with significant submucosal component are  identified, the larger of which measures 2.1 x 1.5 cm as measured  on 3-D reformatted images, which may not be an accurate  representation of the true size of the mass but instead provide  approximate measurements.  Endometrium: Borderline trilaminar appearance, measuring 2.0 cm  maximally including an adjacent submucosal fibroid  Right ovary: 2.6 x 1.8 x 1.6 cm. Normal.  Left ovary: 2.3 x 2.1 x 1.8 cm. Normal.   Other Findings: No free fluid  IMPRESSION:  Fibroid uterus as above, with at least two fibroids with dominant  submucosal component and other fibroids with both intramural and  subserosal components. These would be best assessed at pelvic MRI  with contrast, performed as an outpatient for determination of  future treatment options.  Original Report Authenticated By: Christiana Pellant, M.D.     MAU Course  Procedures  GC/CHL culture to lab  MDM  Toradol 60mg  IM given for pain. Patient is back from ultrasound.  Is asking to eat and she would like something for her blood pressure her so she doesn't have to go for further evaluation because she plans to go to work tomorrow.  Will ask Dr.Jackson-Moore when I call with results.   Call to Dr. Tamela Oddi with report of PHI, physical exam finding, Blood pressure readings, lab results and U/S report.  Order given for the patient to call the office in the morning and ask Erskine Squibb to schedule appt to evaluate her fibroids/bleeding and to ask them to get her appointment with MD who will treat her hypertension.  SHe does not  feel she needs further evaluation tonight at another ED.    Assessment and Plan  A;  Abdominal Pain      Abnormal vaginal bleeding      Hypertension      Fibroids  P:  Patient instructed to follow up with Dr. Marcia Brash office tomorrow am for appt with them and a referral for a PCP to evaluate blood pressures     May take ibuprofen beginning tomorrow for pain.  She had TOradol here tonight.  KEY,EVE M 04/29/2013, 7:57 PM

## 2013-04-29 NOTE — MAU Note (Signed)
Cycle varies, as to frequency, but always last 7 days with 3 being heavy.  Started bleeding 07/21, bleeding is heavy as usual, but is painful this time.

## 2013-04-30 ENCOUNTER — Encounter: Payer: Self-pay | Admitting: Obstetrics & Gynecology

## 2013-04-30 LAB — GC/CHLAMYDIA PROBE AMP: GC Probe RNA: NEGATIVE

## 2013-05-01 ENCOUNTER — Encounter: Payer: Self-pay | Admitting: Obstetrics & Gynecology

## 2013-05-11 ENCOUNTER — Encounter: Payer: Self-pay | Admitting: Obstetrics & Gynecology

## 2013-05-11 ENCOUNTER — Ambulatory Visit (INDEPENDENT_AMBULATORY_CARE_PROVIDER_SITE_OTHER): Payer: 59 | Admitting: Obstetrics & Gynecology

## 2013-05-11 ENCOUNTER — Other Ambulatory Visit: Payer: Self-pay | Admitting: *Deleted

## 2013-05-11 VITALS — BP 126/90 | HR 94 | Temp 98.7°F | Ht 64.5 in | Wt 196.0 lb

## 2013-05-11 DIAGNOSIS — D259 Leiomyoma of uterus, unspecified: Secondary | ICD-10-CM | POA: Insufficient documentation

## 2013-05-11 DIAGNOSIS — Z01419 Encounter for gynecological examination (general) (routine) without abnormal findings: Secondary | ICD-10-CM

## 2013-05-11 NOTE — Progress Notes (Signed)
Subjective:     Stacy Berry is a 42 y.o. female here for a routine exam.  Current complaints: follow up visit. Pt was recently in the hospital on 04-29-13. Pt is having extremely heavy and painful. Pt had an ultrasound performed on that day. Pt is here to discuss a hysterectomy. Personal health questionnaire reviewed: yes.   Gynecologic History Patient's last menstrual period was 04/27/2013. Contraception: condoms Last Pap: 2012. Results were: normal Last mammogram: 2012. Results were: normal  Obstetric History OB History   Grav Para Term Preterm Abortions TAB SAB Ect Mult Living   5 4 4  1   1  4      # Outc Date GA Lbr Len/2nd Wgt Sex Del Anes PTL Lv   1 ECT            Comments: ruptured   2 TRM     F SVD   Yes   3 TRM     M SVD  No Yes   4 TRM     M SVD  No Yes   5 TRM     M SVD  No Yes       The following portions of the patient's history were reviewed and updated as appropriate: allergies, current medications, past family history, past medical history, past social history, past surgical history and problem list.  Review of Systems Pertinent items are noted in HPI.    Objective:      General appearance: alert Breasts: normal appearance, no masses or tenderness Abdomen: soft, non-tender; bowel sounds normal; no masses,  no organomegaly Pelvic: cervix normal in appearance, external genitalia normal, no adnexal masses or tenderness, uterus normal size, shape, and consistency and vagina normal without discharge      Assessment:   Symptomatic uterine fibroids   Plan:    A TRH is planned

## 2013-05-11 NOTE — Addendum Note (Signed)
Addended by: Antionette Char on: 05/11/2013 11:21 AM   Modules accepted: Orders

## 2013-05-11 NOTE — Patient Instructions (Signed)
Hysterectomy Information  A hysterectomy is a procedure where your uterus is surgically removed. It will no longer be possible to have menstrual periods or to become pregnant. The tubes and ovaries can be removed (bilateral salpingo-oopherectomy) during this surgery as well.  REASONS FOR A HYSTERECTOMY  Persistent, abnormal bleeding.  Lasting (chronic) pelvic pain or infection.  The lining of the uterus (endometrium) starts growing outside the uterus (endometriosis).  The endometrium starts growing in the muscle of the uterus (adenomyosis).  The uterus falls down into the vagina (pelvic organ prolapse).  Symptomatic uterine fibroids.  Precancerous cells.  Cervical cancer or uterine cancer. TYPES OF HYSTERECTOMIES  Supracervical hysterectomy. This type removes the top part of the uterus, but not the cervix.  Total hysterectomy. This type removes the uterus and cervix.  Radical hysterectomy. This type removes the uterus, cervix, and the fibrous tissue that holds the uterus in place in the pelvis (parametrium). WAYS A HYSTERECTOMY CAN BE PERFORMED  Abdominal hysterectomy. A large surgical cut (incision) is made in the abdomen. The uterus is removed through this incision.  Vaginal hysterectomy. An incision is made in the vagina. The uterus is removed through this incision. There are no abdominal incisions.  Conventional laparoscopic hysterectomy. A thin, lighted tube with a camera (laparoscope) is inserted into 3 or 4 small incisions in the abdomen. The uterus is cut into small pieces. The small pieces are removed through the incisions, or they are removed through the vagina.  Laparoscopic assisted vaginal hysterectomy (LAVH). Three or four small incisions are made in the abdomen. Part of the surgery is performed laparoscopically and part vaginally. The uterus is removed through the vagina.  Robot-assisted laparoscopic hysterectomy. A laparoscope is inserted into 3 or 4 small  incisions in the abdomen. A computer-controlled device is used to give the surgeon a 3D image. This allows for more precise movements of surgical instruments. The uterus is cut into small pieces and removed through the incisions or removed through the vagina. RISKS OF HYSTERECTOMY   Bleeding and risk of blood transfusion. Tell your caregiver if you do not want to receive any blood products.  Blood clots in the legs or lung.  Infection.  Injury to surrounding organs.  Anesthesia problems or side effects.  Conversion to an abdominal hysterectomy. WHAT TO EXPECT AFTER A HYSTERECTOMY  You will be given pain medicine.  You will need to have someone with you for the first 3 to 5 days after you go home.  You will need to follow up with your surgeon in 2 to 4 weeks after surgery to evaluate your progress.  You may have early menopause symptoms like hot flashes, night sweats, and insomnia.  If you had a hysterectomy for a problem that was not a cancer or a condition that could lead to cancer, then you no longer need Pap tests. However, even if you no longer need a Pap test, a regular exam is a good idea to make sure no other problems are starting. Document Released: 03/20/2001 Document Revised: 12/17/2011 Document Reviewed: 05/05/2011 Exit[employer] Patient Information 2014 [employer].  

## 2013-05-12 LAB — PAP IG AND HPV HIGH-RISK: HPV DNA High Risk: DETECTED — AB

## 2013-05-15 ENCOUNTER — Encounter: Payer: Self-pay | Admitting: Obstetrics & Gynecology

## 2013-05-15 DIAGNOSIS — IMO0002 Reserved for concepts with insufficient information to code with codable children: Secondary | ICD-10-CM | POA: Insufficient documentation

## 2013-05-21 ENCOUNTER — Encounter: Payer: Self-pay | Admitting: Obstetrics & Gynecology

## 2013-06-02 ENCOUNTER — Encounter (HOSPITAL_COMMUNITY): Payer: Self-pay | Admitting: Pharmacy Technician

## 2013-06-03 ENCOUNTER — Encounter: Payer: Self-pay | Admitting: Obstetrics & Gynecology

## 2013-06-05 ENCOUNTER — Encounter (HOSPITAL_COMMUNITY)
Admission: RE | Admit: 2013-06-05 | Discharge: 2013-06-05 | Disposition: A | Discharge status: Home / Self Care | Payer: 59 | Source: Ambulatory Visit | Attending: Obstetrics & Gynecology | Admitting: Obstetrics & Gynecology

## 2013-06-05 ENCOUNTER — Encounter (HOSPITAL_COMMUNITY): Payer: Self-pay

## 2013-06-05 DIAGNOSIS — D259 Leiomyoma of uterus, unspecified: Secondary | ICD-10-CM | POA: Insufficient documentation

## 2013-06-05 DIAGNOSIS — Z01812 Encounter for preprocedural laboratory examination: Secondary | ICD-10-CM | POA: Insufficient documentation

## 2013-06-05 LAB — CBC
HCT: 39 % (ref 36.0–46.0)
MCH: 32.2 pg (ref 26.0–34.0)
MCHC: 33.1 g/dL (ref 30.0–36.0)
MCV: 97.3 fL (ref 78.0–100.0)
Platelets: 368 10*3/uL (ref 150–400)
RDW: 13.5 % (ref 11.5–15.5)

## 2013-06-05 LAB — BASIC METABOLIC PANEL
BUN: 7 mg/dL (ref 6–23)
Calcium: 9.2 mg/dL (ref 8.4–10.5)
Creatinine, Ser: 0.8 mg/dL (ref 0.50–1.10)
GFR calc Af Amer: >90 mL/min (ref >90)
GFR calc non Af Amer: 88 mL/min — ABNORMAL LOW (ref >90)

## 2013-06-05 NOTE — Patient Instructions (Addendum)
20 Stacy Berry  06/05/2013   Your procedure is scheduled on:  9-5 -2014  Report to [facility] at      0530  AM   Call this number if you have problems the morning of surgery: [phone]  Or Presurgical Testing [phone](Inari Shin)      Do not eat food:After Midnight.   Take these medicines the morning of surgery with A SIP OF WATER: none.   Do not wear jewelry, make-up or nail polish.  Do not wear lotions, powders, or perfumes. You may wear deodorant.  Do not shave 12 hours prior to first CHG shower(legs and under arms).(face and neck okay.)  Do not bring valuables to the hospital.  Contacts, dentures or bridgework,body piercing,  may not be worn into surgery.  Leave suitcase in the car. After surgery it may be brought to your room.  For patients admitted to the hospital, checkout time is 11:00 AM the day of discharge.   Patients discharged the day of surgery will not be allowed to drive home. Must have responsible person with you x 24 hours once discharged.  Name and phone number of your driver: Daughter or family  Special Instructions: CHG(Chlorhedine 4%-"Hibiclens","Betasept","Aplicare") Shower Use Special Wash: see special instructions.(avoid face and genitals)   Please read over the following fact sheets that you were given: Blood Transfusion fact sheet.    Failure to follow these instructions may result in Cancellation of your surgery.   Patient signature_______________________________________________________

## 2013-06-11 NOTE — H&P (Signed)
  Subjective:  Stacy Berry is a 42 y.o. female with heavy, painful menses.  Onset of symptoms was gradual starting several years ago with gradually worsening course since that time. Bleeding is characterized as heavy.  Associated symptoms include pelvic pain.  Evaluation to date: D & C: negative and pelvic ultrasound: positive for uterine fibroids.  Two fibroids have a submucous component.  Treatment to date: prior endometrial implant fulguration  Pertinent Gyn History:  Menses: see above Bleeding: see above   Preventive screening:  Last pap: normal Date: 2012  Patient Active Problem List   Diagnosis Date Noted  . 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 Leiomyoma of uterus, unspecified 05/11/2013  . 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 Hypertension   . Hyperlipidemia   . Infection     UTI  . Fibroid     Past Surgical History  Procedure Laterality Date  . Ectopic pregnancy surgery    . Endometrial ablation      No prescriptions prior to admission   No Known Allergies  History  Substance Use Topics  . Smoking status: Current Some Day Smoker  . Smokeless tobacco: Never Used  . Alcohol Use: Yes     Comment: occ    Family History  Problem Relation Age of Onset  . Cancer Maternal Grandmother     breast  . Cancer Paternal Grandfather      Review of Systems Pertinent items are noted in HPI.    Objective:   Vital signs in last 24 hours:    General appearance: alert Breasts: normal appearance, no masses or tenderness Abdomen: soft, non-tender; bowel sounds normal; no masses,  no organomegaly Pelvic: cervix normal in appearance, external genitalia normal, no adnexal masses or tenderness, uterus slightly enlarged and vagina normal without discharge       Assessment/Plan: AUB-L,sm Secondary dysmenorrhea    I had a lengthy discussion with the patient regarding her bleeding and consideration for robotic-assisted  hysterectomy.  Procedure, risks, reasons, benefits and complications (including injury to bowel, bladder, major blood vessel, ureter, bleeding, possibility of transfusion, infection, or fistula formation) were reviewed in detail. Consent was signed and preop testing was ordered.  Instructions were reviewed, including NPO after midnight.

## 2013-06-12 ENCOUNTER — Ambulatory Visit (HOSPITAL_COMMUNITY)
Admission: RE | Admit: 2013-06-12 | Discharge: 2013-06-12 | Disposition: A | Discharge status: Home / Self Care | Payer: 59 | Source: Ambulatory Visit | Attending: Obstetrics & Gynecology | Admitting: Obstetrics & Gynecology

## 2013-06-12 ENCOUNTER — Encounter (HOSPITAL_COMMUNITY): Payer: Self-pay | Admitting: Pharmacist

## 2013-06-12 ENCOUNTER — Encounter (HOSPITAL_COMMUNITY): Payer: Self-pay | Admitting: *Deleted

## 2013-06-12 ENCOUNTER — Ambulatory Visit (HOSPITAL_COMMUNITY): Payer: 59

## 2013-06-12 ENCOUNTER — Encounter (HOSPITAL_COMMUNITY): Payer: Self-pay | Admitting: Anesthesiology

## 2013-06-12 ENCOUNTER — Other Ambulatory Visit: Payer: Self-pay | Admitting: *Deleted

## 2013-06-12 ENCOUNTER — Ambulatory Visit (HOSPITAL_COMMUNITY): Payer: 59 | Admitting: Anesthesiology

## 2013-06-12 ENCOUNTER — Encounter (HOSPITAL_COMMUNITY)
Admission: RE | Disposition: A | Discharge status: Home / Self Care | Payer: Self-pay | Source: Ambulatory Visit | Attending: Obstetrics & Gynecology

## 2013-06-12 DIAGNOSIS — Z538 Procedure and treatment not carried out for other reasons: Secondary | ICD-10-CM | POA: Insufficient documentation

## 2013-06-12 DIAGNOSIS — Z01818 Encounter for other preprocedural examination: Secondary | ICD-10-CM | POA: Insufficient documentation

## 2013-06-12 DIAGNOSIS — N946 Dysmenorrhea, unspecified: Secondary | ICD-10-CM | POA: Insufficient documentation

## 2013-06-12 DIAGNOSIS — E669 Obesity, unspecified: Secondary | ICD-10-CM | POA: Insufficient documentation

## 2013-06-12 DIAGNOSIS — R87810 Cervical high risk human papillomavirus (HPV) DNA test positive: Secondary | ICD-10-CM | POA: Insufficient documentation

## 2013-06-12 DIAGNOSIS — D250 Submucous leiomyoma of uterus: Secondary | ICD-10-CM | POA: Insufficient documentation

## 2013-06-12 DIAGNOSIS — I10 Essential (primary) hypertension: Secondary | ICD-10-CM | POA: Insufficient documentation

## 2013-06-12 DIAGNOSIS — F17200 Nicotine dependence, unspecified, uncomplicated: Secondary | ICD-10-CM | POA: Insufficient documentation

## 2013-06-12 DIAGNOSIS — Z01810 Encounter for preprocedural cardiovascular examination: Secondary | ICD-10-CM | POA: Insufficient documentation

## 2013-06-12 DIAGNOSIS — Z01812 Encounter for preprocedural laboratory examination: Secondary | ICD-10-CM | POA: Insufficient documentation

## 2013-06-12 DIAGNOSIS — E785 Hyperlipidemia, unspecified: Secondary | ICD-10-CM | POA: Insufficient documentation

## 2013-06-12 LAB — TYPE AND SCREEN
ABO/RH(D): O POS
Antibody Screen: NEGATIVE

## 2013-06-12 LAB — ABO/RH: ABO/RH(D): O POS

## 2013-06-12 SURGERY — ROBOTIC ASSISTED TOTAL HYSTERECTOMY
Anesthesia: General

## 2013-06-12 MED ORDER — LACTATED RINGERS IV SOLN
INTRAVENOUS | Status: DC | PRN
  Administered 2013-06-12: 07:00:00 via INTRAVENOUS

## 2013-06-12 MED ORDER — METRONIDAZOLE IN NACL 5-0.79 MG/ML-% IV SOLN: 500.0000 mg | Freq: Once | INTRAVENOUS | Status: DC

## 2013-06-12 MED ORDER — CEFAZOLIN SODIUM-DEXTROSE 2-3 GM-% IV SOLR: 2.0000 g | INTRAVENOUS | Status: DC

## 2013-06-12 NOTE — Anesthesia Postprocedure Evaluation (Signed)
Patient surgical procedure cancelled

## 2013-06-12 NOTE — Progress Notes (Signed)
Patient returned to short stay. Surgery cancelled while patient in holding because she showed antibodies on her T/S which was drawn this am. Disch via ambulatory with daughter to Home. She has been rescheduled for surgery on Monday at [facility]. She knows they will be calling her with instructions.

## 2013-06-12 NOTE — Preoperative (Signed)
Beta Blockers   Reason not to administer Beta Blockers:Not Applicable 

## 2013-06-12 NOTE — Anesthesia Preprocedure Evaluation (Addendum)
Anesthesia Evaluation  Patient identified by MRN, date of birth, ID band Patient awake    Reviewed: Allergy & Precautions, H&P , NPO status , Patient's Chart, lab work & pertinent test results  Airway Mallampati: II TM Distance: >3 FB Neck ROM: Full    Dental no notable dental hx.    Pulmonary Current Smoker,  breath sounds clear to auscultation  Pulmonary exam normal       Cardiovascular Exercise Tolerance: Good hypertension, Pt. on medications negative cardio ROS  Rhythm:Regular Rate:Normal     Neuro/Psych negative neurological ROS  negative psych ROS   GI/Hepatic negative GI ROS, Neg liver ROS,   Endo/Other  negative endocrine ROS  Renal/GU negative Renal ROS  negative genitourinary   Musculoskeletal negative musculoskeletal ROS (+)   Abdominal (+) + obese,   Peds negative pediatric ROS (+)  Hematology negative hematology ROS (+)   Anesthesia Other Findings   Reproductive/Obstetrics negative OB ROS                           Anesthesia Physical Anesthesia Plan  ASA: II  Anesthesia Plan: General   Post-op Pain Management:    Induction: Intravenous  Airway Management Planned: Oral ETT  Additional Equipment:   Intra-op Plan:   Post-operative Plan: Extubation in OR  Informed Consent: I have reviewed the patients History and Physical, chart, labs and discussed the procedure including the risks, benefits and alternatives for the proposed anesthesia with the patient or authorized representative who has indicated his/her understanding and acceptance.   Dental advisory given  Plan Discussed with: CRNA  Anesthesia Plan Comments: (Antibodies in blood. Cannot crossmatch until 4 PM. Patient has been cancelled by Dr. Tamela Oddi.)       Anesthesia Quick Evaluation

## 2013-06-12 NOTE — Transfer of Care (Signed)
Patient's surgical procedure cancelled

## 2013-06-15 ENCOUNTER — Ambulatory Visit (HOSPITAL_COMMUNITY): Payer: 59 | Admitting: Anesthesiology

## 2013-06-15 ENCOUNTER — Encounter (HOSPITAL_COMMUNITY)
Admission: RE | Disposition: A | Discharge status: Home / Self Care | Payer: Self-pay | Source: Ambulatory Visit | Attending: Obstetrics & Gynecology

## 2013-06-15 ENCOUNTER — Encounter (HOSPITAL_COMMUNITY): Payer: Self-pay | Admitting: Anesthesiology

## 2013-06-15 ENCOUNTER — Encounter (HOSPITAL_COMMUNITY): Payer: Self-pay | Admitting: *Deleted

## 2013-06-15 ENCOUNTER — Observation Stay (HOSPITAL_COMMUNITY)
Admission: RE | Admit: 2013-06-15 | Discharge: 2013-06-16 | Disposition: A | Discharge status: Home / Self Care | Payer: 59 | Source: Ambulatory Visit | Attending: Obstetrics & Gynecology | Admitting: Obstetrics & Gynecology

## 2013-06-15 DIAGNOSIS — D250 Submucous leiomyoma of uterus: Secondary | ICD-10-CM | POA: Insufficient documentation

## 2013-06-15 DIAGNOSIS — D259 Leiomyoma of uterus, unspecified: Secondary | ICD-10-CM

## 2013-06-15 DIAGNOSIS — D251 Intramural leiomyoma of uterus: Secondary | ICD-10-CM | POA: Insufficient documentation

## 2013-06-15 DIAGNOSIS — N946 Dysmenorrhea, unspecified: Secondary | ICD-10-CM | POA: Insufficient documentation

## 2013-06-15 DIAGNOSIS — N852 Hypertrophy of uterus: Secondary | ICD-10-CM | POA: Insufficient documentation

## 2013-06-15 DIAGNOSIS — N949 Unspecified condition associated with female genital organs and menstrual cycle: Secondary | ICD-10-CM | POA: Insufficient documentation

## 2013-06-15 DIAGNOSIS — D252 Subserosal leiomyoma of uterus: Secondary | ICD-10-CM | POA: Insufficient documentation

## 2013-06-15 DIAGNOSIS — N938 Other specified abnormal uterine and vaginal bleeding: Principal | ICD-10-CM | POA: Insufficient documentation

## 2013-06-15 HISTORY — PX: ROBOTIC ASSISTED TOTAL HYSTERECTOMY: SHX6085

## 2013-06-15 SURGERY — ROBOTIC ASSISTED TOTAL HYSTERECTOMY
Anesthesia: General | Site: Abdomen | Laterality: Right | Wound class: Clean Contaminated

## 2013-06-15 MED ORDER — KETOROLAC TROMETHAMINE 30 MG/ML IJ SOLN
15.0000 mg | Freq: Once | INTRAMUSCULAR | Status: DC | PRN
Start: 2013-06-15 — End: 2013-06-15

## 2013-06-15 MED ORDER — LACTATED RINGERS IV SOLN
INTRAVENOUS | Status: DC
  Administered 2013-06-15 (×4): via INTRAVENOUS

## 2013-06-15 MED ORDER — LACTATED RINGERS IV SOLN
INTRAVENOUS | Status: DC
  Administered 2013-06-15 – 2013-06-16 (×2): via INTRAVENOUS

## 2013-06-15 MED ORDER — KETOROLAC TROMETHAMINE 30 MG/ML IJ SOLN: 30.0000 mg | Freq: Four times a day (QID) | INTRAMUSCULAR | Status: DC

## 2013-06-15 MED ORDER — PROPOFOL 10 MG/ML IV EMUL
INTRAVENOUS | Status: AC
  Filled 2013-06-15: qty 20

## 2013-06-15 MED ORDER — CEFAZOLIN SODIUM-DEXTROSE 2-3 GM-% IV SOLR
INTRAVENOUS | Status: AC
  Filled 2013-06-15: qty 50

## 2013-06-15 MED ORDER — ROCURONIUM BROMIDE 100 MG/10ML IV SOLN
INTRAVENOUS | Status: DC | PRN
  Administered 2013-06-15: 5 mg via INTRAVENOUS
  Administered 2013-06-15: 45 mg via INTRAVENOUS
  Administered 2013-06-15: 15 mg via INTRAVENOUS

## 2013-06-15 MED ORDER — FENTANYL CITRATE 0.05 MG/ML IJ SOLN
INTRAMUSCULAR | Status: AC
  Filled 2013-06-15: qty 5

## 2013-06-15 MED ORDER — ONDANSETRON HCL 4 MG PO TABS: 4.0000 mg | ORAL_TABLET | Freq: Four times a day (QID) | ORAL | Status: DC | PRN

## 2013-06-15 MED ORDER — CEFAZOLIN SODIUM-DEXTROSE 2-3 GM-% IV SOLR
2.0000 g | INTRAVENOUS | Status: AC
  Administered 2013-06-15: 2 g via INTRAVENOUS

## 2013-06-15 MED ORDER — NEOSTIGMINE METHYLSULFATE 1 MG/ML IJ SOLN
INTRAMUSCULAR | Status: DC | PRN
  Administered 2013-06-15: 3 mg via INTRAVENOUS

## 2013-06-15 MED ORDER — DOCUSATE SODIUM 100 MG PO CAPS
100.0000 mg | ORAL_CAPSULE | Freq: Two times a day (BID) | ORAL | Status: DC
  Administered 2013-06-15: 100 mg via ORAL
  Filled 2013-06-15: qty 1

## 2013-06-15 MED ORDER — ONDANSETRON HCL 4 MG/2ML IJ SOLN
INTRAMUSCULAR | Status: AC
  Filled 2013-06-15: qty 2

## 2013-06-15 MED ORDER — ONDANSETRON HCL 4 MG/2ML IJ SOLN
4.0000 mg | Freq: Four times a day (QID) | INTRAMUSCULAR | Status: DC | PRN
Start: 2013-06-15 — End: 2013-06-16

## 2013-06-15 MED ORDER — ACETAMINOPHEN 10 MG/ML IV SOLN
1000.0000 mg | Freq: Once | INTRAVENOUS | Status: DC
  Filled 2013-06-15: qty 100

## 2013-06-15 MED ORDER — FENTANYL CITRATE 0.05 MG/ML IJ SOLN
INTRAMUSCULAR | Status: AC
  Administered 2013-06-15: 50 ug via INTRAVENOUS
  Filled 2013-06-15: qty 2

## 2013-06-15 MED ORDER — HYDROMORPHONE HCL PF 1 MG/ML IJ SOLN
INTRAMUSCULAR | Status: DC | PRN
  Administered 2013-06-15 (×2): 0.5 mg via INTRAVENOUS

## 2013-06-15 MED ORDER — HYDROMORPHONE HCL PF 1 MG/ML IJ SOLN
INTRAMUSCULAR | Status: AC
  Filled 2013-06-15: qty 1

## 2013-06-15 MED ORDER — PHENYLEPHRINE HCL 10 MG/ML IJ SOLN
INTRAMUSCULAR | Status: DC | PRN
  Administered 2013-06-15 (×2): 40 ug via INTRAVENOUS

## 2013-06-15 MED ORDER — KETOROLAC TROMETHAMINE 30 MG/ML IJ SOLN
INTRAMUSCULAR | Status: DC | PRN
  Administered 2013-06-15: 30 mg via INTRAVENOUS

## 2013-06-15 MED ORDER — METRONIDAZOLE IN NACL 5-0.79 MG/ML-% IV SOLN
500.0000 mg | Freq: Once | INTRAVENOUS | Status: AC
  Administered 2013-06-15: .5 g via INTRAVENOUS
  Filled 2013-06-15: qty 100

## 2013-06-15 MED ORDER — MIDAZOLAM HCL 2 MG/2ML IJ SOLN
INTRAMUSCULAR | Status: DC | PRN
  Administered 2013-06-15: 2 mg via INTRAVENOUS

## 2013-06-15 MED ORDER — LIDOCAINE HCL (CARDIAC) 20 MG/ML IV SOLN
INTRAVENOUS | Status: AC
  Filled 2013-06-15: qty 5

## 2013-06-15 MED ORDER — MORPHINE SULFATE 4 MG/ML IJ SOLN
1.0000 mg | INTRAMUSCULAR | Status: DC | PRN
  Administered 2013-06-16: 2 mg via INTRAVENOUS
  Filled 2013-06-15: qty 1

## 2013-06-15 MED ORDER — LIDOCAINE HCL (CARDIAC) 20 MG/ML IV SOLN
INTRAVENOUS | Status: DC | PRN
  Administered 2013-06-15: 80 mg via INTRAVENOUS
  Administered 2013-06-15: 20 mg via INTRAVENOUS

## 2013-06-15 MED ORDER — DEXAMETHASONE SODIUM PHOSPHATE 10 MG/ML IJ SOLN
INTRAMUSCULAR | Status: AC
  Filled 2013-06-15: qty 1

## 2013-06-15 MED ORDER — PROPOFOL 10 MG/ML IV BOLUS
INTRAVENOUS | Status: DC | PRN
  Administered 2013-06-15: 200 mg via INTRAVENOUS

## 2013-06-15 MED ORDER — KETOROLAC TROMETHAMINE 30 MG/ML IJ SOLN: 30.0000 mg | Freq: Once | INTRAMUSCULAR | Status: DC

## 2013-06-15 MED ORDER — BUPIVACAINE HCL (PF) 0.25 % IJ SOLN
INTRAMUSCULAR | Status: DC | PRN
  Administered 2013-06-15: 16 mL

## 2013-06-15 MED ORDER — ARTIFICIAL TEARS OP OINT
TOPICAL_OINTMENT | OPHTHALMIC | Status: AC
  Filled 2013-06-15: qty 3.5

## 2013-06-15 MED ORDER — MIDAZOLAM HCL 2 MG/2ML IJ SOLN
INTRAMUSCULAR | Status: AC
  Filled 2013-06-15: qty 2

## 2013-06-15 MED ORDER — ROCURONIUM BROMIDE 50 MG/5ML IV SOLN
INTRAVENOUS | Status: AC
  Filled 2013-06-15: qty 1

## 2013-06-15 MED ORDER — GLYCOPYRROLATE 0.2 MG/ML IJ SOLN
INTRAMUSCULAR | Status: DC | PRN
  Administered 2013-06-15: 0.6 mg via INTRAVENOUS

## 2013-06-15 MED ORDER — ONDANSETRON HCL 4 MG/2ML IJ SOLN
INTRAMUSCULAR | Status: DC | PRN
  Administered 2013-06-15: 4 mg via INTRAVENOUS

## 2013-06-15 MED ORDER — BUPIVACAINE HCL (PF) 0.25 % IJ SOLN
INTRAMUSCULAR | Status: AC
  Filled 2013-06-15: qty 30

## 2013-06-15 MED ORDER — OXYCODONE-ACETAMINOPHEN 5-325 MG PO TABS
1.0000 | ORAL_TABLET | ORAL | Status: DC | PRN
  Administered 2013-06-15: 1 via ORAL
  Administered 2013-06-16 (×2): 2 via ORAL
  Filled 2013-06-15: qty 2
  Filled 2013-06-15: qty 1
  Filled 2013-06-15: qty 2

## 2013-06-15 MED ORDER — PANTOPRAZOLE SODIUM 40 MG PO TBEC
40.0000 mg | DELAYED_RELEASE_TABLET | Freq: Every day | ORAL | Status: DC
  Filled 2013-06-15: qty 1

## 2013-06-15 MED ORDER — LACTATED RINGERS IR SOLN
Status: DC | PRN
  Administered 2013-06-15: 3000 mL

## 2013-06-15 MED ORDER — ACETAMINOPHEN 10 MG/ML IV SOLN
INTRAVENOUS | Status: DC | PRN
  Administered 2013-06-15: 1000 mg via INTRAVENOUS

## 2013-06-15 MED ORDER — BISACODYL 10 MG RE SUPP: 10.0000 mg | Freq: Every day | RECTAL | Status: DC | PRN

## 2013-06-15 MED ORDER — METOCLOPRAMIDE HCL 5 MG/ML IJ SOLN
10.0000 mg | Freq: Once | INTRAMUSCULAR | Status: AC | PRN
  Administered 2013-06-15: 10 mg via INTRAVENOUS

## 2013-06-15 MED ORDER — ZOLPIDEM TARTRATE 5 MG PO TABS: 5.0000 mg | ORAL_TABLET | Freq: Every evening | ORAL | Status: DC | PRN

## 2013-06-15 MED ORDER — KETOROLAC TROMETHAMINE 30 MG/ML IJ SOLN
INTRAMUSCULAR | Status: AC
  Filled 2013-06-15: qty 1

## 2013-06-15 MED ORDER — KETOROLAC TROMETHAMINE 30 MG/ML IJ SOLN
30.0000 mg | Freq: Four times a day (QID) | INTRAMUSCULAR | Status: DC
  Administered 2013-06-15 – 2013-06-16 (×3): 30 mg via INTRAVENOUS
  Filled 2013-06-15 (×3): qty 1

## 2013-06-15 MED ORDER — DEXAMETHASONE SODIUM PHOSPHATE 10 MG/ML IJ SOLN
INTRAMUSCULAR | Status: DC | PRN
  Administered 2013-06-15: 10 mg via INTRAVENOUS

## 2013-06-15 MED ORDER — FENTANYL CITRATE 0.05 MG/ML IJ SOLN
INTRAMUSCULAR | Status: DC | PRN
  Administered 2013-06-15: 100 ug via INTRAVENOUS
  Administered 2013-06-15: 50 ug via INTRAVENOUS
  Administered 2013-06-15: 100 ug via INTRAVENOUS
  Administered 2013-06-15 (×5): 50 ug via INTRAVENOUS

## 2013-06-15 MED ORDER — FENTANYL CITRATE 0.05 MG/ML IJ SOLN
25.0000 ug | INTRAMUSCULAR | Status: DC | PRN
  Administered 2013-06-15 (×4): 50 ug via INTRAVENOUS

## 2013-06-15 MED ORDER — MENTHOL 3 MG MT LOZG: 1.0000 | LOZENGE | OROMUCOSAL | Status: DC | PRN

## 2013-06-15 MED ORDER — METOCLOPRAMIDE HCL 5 MG/ML IJ SOLN
INTRAMUSCULAR | Status: AC
  Administered 2013-06-15: 10 mg via INTRAVENOUS
  Filled 2013-06-15: qty 2

## 2013-06-15 MED ORDER — MAGNESIUM HYDROXIDE 400 MG/5ML PO SUSP
30.0000 mL | Freq: Two times a day (BID) | ORAL | Status: DC
  Administered 2013-06-15: 30 mL via ORAL
  Filled 2013-06-15: qty 30

## 2013-06-15 MED ORDER — SIMETHICONE 80 MG PO CHEW: 80.0000 mg | CHEWABLE_TABLET | Freq: Four times a day (QID) | ORAL | Status: DC | PRN

## 2013-06-15 SURGICAL SUPPLY — 60 items
ADH SKN CLS APL DERMABOND .7 (GAUZE/BANDAGES/DRESSINGS) ×1
APL SKNCLS STERI-STRIP NONHPOA (GAUZE/BANDAGES/DRESSINGS) ×1
BARRIER ADHS 3X4 INTERCEED (GAUZE/BANDAGES/DRESSINGS) ×2 IMPLANT
BENZOIN TINCTURE PRP APPL 2/3 (GAUZE/BANDAGES/DRESSINGS) ×2 IMPLANT
BLADELESS LONG 8MM (BLADE) ×2 IMPLANT
BRR ADH 4X3 ABS CNTRL BYND (GAUZE/BANDAGES/DRESSINGS) ×1
CABLE HIGH FREQUENCY MONO STRZ (ELECTRODE) ×2 IMPLANT
CLOTH BEACON ORANGE TIMEOUT ST (SAFETY) ×2 IMPLANT
CONT PATH 16OZ SNAP LID 3702 (MISCELLANEOUS) ×2 IMPLANT
CORDS BIPOLAR (ELECTRODE) IMPLANT
COVER MAYO STAND STRL (DRAPES) ×2 IMPLANT
COVER TABLE BACK 60X90 (DRAPES) ×4 IMPLANT
COVER TIP SHEARS 8 DVNC (MISCELLANEOUS) ×1 IMPLANT
COVER TIP SHEARS 8MM DA VINCI (MISCELLANEOUS) ×1
DECANTER SPIKE VIAL GLASS SM (MISCELLANEOUS) ×2 IMPLANT
DERMABOND ADVANCED (GAUZE/BANDAGES/DRESSINGS) ×1
DERMABOND ADVANCED .7 DNX12 (GAUZE/BANDAGES/DRESSINGS) IMPLANT
DRAPE HUG U DISPOSABLE (DRAPE) ×2 IMPLANT
DRAPE LG THREE QUARTER DISP (DRAPES) ×4 IMPLANT
DRAPE WARM FLUID 44X44 (DRAPE) ×2 IMPLANT
ELECT REM PT RETURN 9FT ADLT (ELECTROSURGICAL) ×2
ELECTRODE REM PT RTRN 9FT ADLT (ELECTROSURGICAL) ×1 IMPLANT
EVACUATOR SMOKE 8.L (FILTER) ×2 IMPLANT
GAUZE VASELINE 3X9 (GAUZE/BANDAGES/DRESSINGS) IMPLANT
GLOVE BIO SURGEON STRL SZ 6.5 (GLOVE) ×4 IMPLANT
GLOVE BIO SURGEON STRL SZ8 (GLOVE) ×4 IMPLANT
GLOVE BIOGEL PI IND STRL 7.0 (GLOVE) ×2 IMPLANT
GLOVE BIOGEL PI INDICATOR 7.0 (GLOVE) ×2
GLOVE ECLIPSE 6.5 STRL STRAW (GLOVE) ×8 IMPLANT
GOWN PREVENTION PLUS LG XLONG (DISPOSABLE) ×14 IMPLANT
MANIPULATOR UTERINE 4.5 ZUMI (MISCELLANEOUS) ×1 IMPLANT
NEEDLE INSUFFLATION 120MM (ENDOMECHANICALS) ×2 IMPLANT
NS IRRIG 1000ML POUR BTL (IV SOLUTION) ×6 IMPLANT
OCCLUDER COLPOPNEUMO (BALLOONS) ×1 IMPLANT
PACK LAVH (CUSTOM PROCEDURE TRAY) ×2 IMPLANT
PAD PREP 24X48 CUFFED NSTRL (MISCELLANEOUS) ×4 IMPLANT
SCRUB PCMX 4 OZ (MISCELLANEOUS) ×2 IMPLANT
SET IRRIG TUBING LAPAROSCOPIC (IRRIGATION / IRRIGATOR) ×2 IMPLANT
SOLUTION ELECTROLUBE (MISCELLANEOUS) ×2 IMPLANT
SPONGE LAP 18X18 X RAY DECT (DISPOSABLE) IMPLANT
STRIP CLOSURE SKIN 1/4X4 (GAUZE/BANDAGES/DRESSINGS) ×2 IMPLANT
SUT VIC AB 0 CT1 27 (SUTURE) ×18
SUT VIC AB 0 CT1 27XBRD ANTBC (SUTURE) IMPLANT
SUT VICRYL 0 UR6 27IN ABS (SUTURE) ×2 IMPLANT
SYR 50ML LL SCALE MARK (SYRINGE) ×2 IMPLANT
SYSTEM CONVERTIBLE TROCAR (TROCAR) IMPLANT
TIP UTERINE 5.1X6CM LAV DISP (MISCELLANEOUS) IMPLANT
TIP UTERINE 6.7X10CM GRN DISP (MISCELLANEOUS) IMPLANT
TIP UTERINE 6.7X6CM WHT DISP (MISCELLANEOUS) IMPLANT
TIP UTERINE 6.7X8CM BLUE DISP (MISCELLANEOUS) IMPLANT
TOWEL OR 17X24 6PK STRL BLUE (TOWEL DISPOSABLE) ×6 IMPLANT
TRAY FOLEY BAG SILVER LF 14FR (CATHETERS) ×2 IMPLANT
TROCAR 12M 150ML BLUNT (TROCAR) ×1 IMPLANT
TROCAR DILATING TIP 12MM 150MM (ENDOMECHANICALS) ×2 IMPLANT
TROCAR DISP BLADELESS 8 DVNC (TROCAR) ×1 IMPLANT
TROCAR DISP BLADELESS 8MM (TROCAR) ×1
TROCAR OPTI TIP 12M 100M (ENDOMECHANICALS) IMPLANT
TROCAR XCEL NON-BLD 5MMX100MML (ENDOMECHANICALS) ×1 IMPLANT
TUBING FILTER THERMOFLATOR (ELECTROSURGICAL) ×2 IMPLANT
WARMER LAPAROSCOPE (MISCELLANEOUS) ×2 IMPLANT

## 2013-06-15 NOTE — H&P (View-Only) (Signed)
  Subjective:  Stacy Berry is a 42 y.o. female with heavy, painful menses.  Onset of symptoms was gradual starting several years ago with gradually worsening course since that time. Bleeding is characterized as heavy.  Associated symptoms include pelvic pain.  Evaluation to date: D & C: negative and pelvic ultrasound: positive for uterine fibroids.  Two fibroids have a submucous component.  Treatment to date: prior endometrial implant fulguration  Pertinent Gyn History:  Menses: see above Bleeding: see above   Preventive screening:  Last pap: normal Date: 2012  Patient Active Problem List   Diagnosis Date Noted  . 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 Leiomyoma of uterus, unspecified 05/11/2013  . 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 Hypertension   . Hyperlipidemia   . Infection     UTI  . Fibroid     Past Surgical History  Procedure Laterality Date  . Ectopic pregnancy surgery    . Endometrial ablation      No prescriptions prior to admission   No Known Allergies  History  Substance Use Topics  . Smoking status: Current Some Day Smoker  . Smokeless tobacco: Never Used  . Alcohol Use: Yes     Comment: occ    Family History  Problem Relation Age of Onset  . Cancer Maternal Grandmother     breast  . Cancer Paternal Grandfather      Review of Systems Pertinent items are noted in HPI.    Objective:   Vital signs in last 24 hours:    General appearance: alert Breasts: normal appearance, no masses or tenderness Abdomen: soft, non-tender; bowel sounds normal; no masses,  no organomegaly Pelvic: cervix normal in appearance, external genitalia normal, no adnexal masses or tenderness, uterus slightly enlarged and vagina normal without discharge       Assessment/Plan: AUB-L,sm Secondary dysmenorrhea    I had a lengthy discussion with the patient regarding her bleeding and consideration for robotic-assisted  hysterectomy.  Procedure, risks, reasons, benefits and complications (including injury to bowel, bladder, major blood vessel, ureter, bleeding, possibility of transfusion, infection, or fistula formation) were reviewed in detail. Consent was signed and preop testing was ordered.  Instructions were reviewed, including NPO after midnight.   

## 2013-06-15 NOTE — Transfer of Care (Signed)
Immediate Anesthesia Transfer of Care Note  Patient: Stacy Berry  Procedure(s) Performed: Procedure(s): ROBOTIC ASSISTED TOTAL HYSTERECTOMY (N/A)  Patient Location: PACU  Anesthesia Type:General  Level of Consciousness: awake, alert , oriented and patient cooperative  Airway & Oxygen Therapy: Patient Spontanous Breathing and Patient connected to nasal cannula oxygen  Post-op Assessment: Report given to PACU RN and Post -op Vital signs reviewed and stable  Post vital signs: Reviewed and stable  Complications: No apparent anesthesia complications

## 2013-06-15 NOTE — Interval H&P Note (Signed)
History and Physical Interval Note:  06/15/2013 12:54 PM  Stacy Berry  has presented today for surgery, with the diagnosis of uterine fibroids  The various methods of treatment have been discussed with the patient and family. After consideration of risks, benefits and other options for treatment, the patient has consented to  Procedure(s): ROBOTIC ASSISTED TOTAL HYSTERECTOMY (N/A) as a surgical intervention .  The patient's history has been reviewed, patient examined, no change in status, stable for surgery.  I have reviewed the patient's chart and labs.  Questions were answered to the patient's satisfaction.     JACKSON-MOORE,[name]

## 2013-06-15 NOTE — Anesthesia Postprocedure Evaluation (Signed)
  Anesthesia Post-op Note  Patient: Stacy Berry  Procedure(s) Performed: Procedure(s): ROBOTIC ASSISTED TOTAL HYSTERECTOMY AND RIGHT SALINGECTOMY (Right) Patient is awake and responsive. Pain and nausea are reasonably well controlled. Vital signs are stable and clinically acceptable. Oxygen saturation is clinically acceptable. There are no apparent anesthetic complications at this time. Patient is ready for discharge.

## 2013-06-15 NOTE — Anesthesia Preprocedure Evaluation (Signed)
Anesthesia Evaluation  Patient identified by MRN, date of birth, ID band Patient awake    Reviewed: Allergy & Precautions, H&P , NPO status , Patient's Chart, lab work & pertinent test results, reviewed documented beta blocker date and time   History of Anesthesia Complications Negative for: history of anesthetic complications  Airway Mallampati: II TM Distance: >3 FB Neck ROM: full    Dental  (+) Teeth Intact   Pulmonary Current Smoker,  breath sounds clear to auscultation  Pulmonary exam normal       Cardiovascular hypertension (has taken no meds since Friday), Rhythm:regular Rate:Normal  hyperlipidemia   Neuro/Psych  Headaches (migraines - occasional), negative psych ROS   GI/Hepatic Neg liver ROS, GERD- (occasional)  ,  Endo/Other  BMI 32.4  Renal/GU negative Renal ROS  Female GU complaint     Musculoskeletal   Abdominal   Peds  Hematology negative hematology ROS (+)   Anesthesia Other Findings   Reproductive/Obstetrics negative OB ROS                           Anesthesia Physical Anesthesia Plan  ASA: II  Anesthesia Plan: General ETT   Post-op Pain Management:    Induction:   Airway Management Planned:   Additional Equipment:   Intra-op Plan:   Post-operative Plan:   Informed Consent: I have reviewed the patients History and Physical, chart, labs and discussed the procedure including the risks, benefits and alternatives for the proposed anesthesia with the patient or authorized representative who has indicated his/her understanding and acceptance.   Dental Advisory Given  Plan Discussed with: CRNA and Surgeon  Anesthesia Plan Comments:         Anesthesia Quick Evaluation

## 2013-06-15 NOTE — Op Note (Signed)
Pre-operative Diagnosis: abnormal uterine bleeding and fibroids  Post-operative Diagnosis: same  Operation: Robotic-assisted hysterectomy with bilateral salpingectomy  Surgeon: Roseanna Rainbow  Assistant: Coral Ceo, MD  Anesthesia: GET  Urine Output: per Anesthesiology  Findings: Multiple subserosal myomas.  Normal ovaries, right tube.  Left tube absent.  Adhesions of sigmoid colon to parieto peritoneum of pelvis.  Estimated Blood Loss:  less than 100 mL                 Total IV Fluids: per Anesthesiology         Specimens: PATHOLOGY               Complications:  None; patient tolerated the procedure well.         Disposition: PACU - hemodynamically stable.         Condition: Stable    Procedure Details  The patient was seen in the Holding Room. The risks, benefits, complications, treatment options, and expected outcomes were discussed with the patient.  The patient concurred with the proposed plan, giving informed consent.  The site of surgery properly noted/marked. The patient was identified as Stacy Berry and the procedure verified as a Robotic-assisted hysterectomy with bilateral salpingectomy. A Time Out was held and the above information confirmed.  After induction of anesthesia, the patient was draped and prepped in the usual sterile manner. Pt was placed in supine position after anesthesia and draped and prepped in the usual sterile manner. The abdominal drape was placed after the CholoraPrep had been allowed to dry for 3 minutes.  Her arms were tucked to her side with all appropriate precautions.  The shoulder blocks were placed in the usual fashion.  The patient was placed in the semi-lithotomy position in [place] stirrups.  The perineum was prepped with Betadine.  Foley catheter was placed.  A sterile speculum was placed in the vagina.  The cervix was grasped with a single-tooth tenaculum and dilated with Shawnie Pons dilators.  The ZUMI uterine manipulator  with a medium colpotomizer ring was placed without difficulty.  A pneum occluder balloon was placed over the manipulator.   OG tube placement was confirmed and to suction.  Approximately 2 cm below the costal margin, in the midclavicular line the skin was anesthestized with 0.25% Marcaine.  A 5 mm incision was made and using a 5 mm Optiview, a 5 mm trocar was placed under direct vision.  The patient's abdomen was insufflated with CO2 gas.  At this point and all points during the procedure, the patient's intra-abdominal pressure did not exceed 15 mmHg.  A 10-12 camera port was place 24 cm above the pubic symphysis.  Bilateral 8 mm ports were place 10 cm and 15 degrees inferior.  All ports were placed under direct visualization.  The 5 mm port was removed.  The incision was extended to accommodate a 10 mm trocar.  A 10 mm trocar and sleeve were advanced under direct visualization.   The robot was docked in the usual fashion.  The round ligament on the right was transected with monopolar cautery.  The anterior and posterior leaves of the broad ligament were opened.  The ureter was identified.  A window was made between the infundibulopelvic ligament and the ureter.  The right utero-ovarian ligament and proximal fallopian tube were was coagulated with bipolar cautery and transected.  The uterine vessels were skeletonized to the level of the Koh ring. The uterine vessels were coagulated with bipolar cautery and transected.   A  C-loop was created in the usual fashion.  A similar procedure was performed on the patient's left side.  The above noted adhesions were divided using monopolar cautery.   The round ligament on the left was transected with monopolar cautery.  The anterior and posterior leaves of the broad ligament were opened.  The ureter was identified.  A window was made between the infundibulopelvic ligament and the ureter.  The left utero-ovarian ligament and proximal fallopian tube were was coagulated with  bipolar cautery and transected.  The uterine vessels were skeletonized to the level of the Koh ring. The uterine vessels were coagulated with bipolar cautery and transected.  A C-loop was created in the usual fashion.   The bladder flap was completed.  At this time, the pneum occluder balloon was insufflated and a colpotomy was performed.  The uterus and cervix were delivered through the vagina.  All pedicles were inspected under low intraabdominal pressures and noted to be hemostatic.  The mesosalpinx on the right was coagulated with bipolar cautery and transected.  The fallopian tube was removed from the abdomen through the assistant port.  The vagina cuff was closed using 0-Vicryl on a CT-1 needle in a running fashion.  The abdomen and pelvis were copiously irrigated.  The needle was removed without difficulty.  The instruments were then removed under direct visualization and the robotic ports removed.  The robot was undocked.  Deep, subcutaneous, figure-of-eight 0-Vicryl sutures on a UR-6 needle were placed in the 10-12 mm supraumbilical and infracostal incisions.  All skin incisions were closed in a subcuticular fashion using 3-0 Monocryl.  Steri-strips and Benzoin were applied.  The vagina was swabbed with minimal bleeding noted.   All instrument and needle counts were correct x  2.

## 2013-06-16 ENCOUNTER — Encounter (HOSPITAL_COMMUNITY): Payer: Self-pay | Admitting: Obstetrics & Gynecology

## 2013-06-16 LAB — CBC
MCH: 32.3 pg (ref 26.0–34.0)
MCHC: 34.5 g/dL (ref 30.0–36.0)
Platelets: 311 10*3/uL (ref 150–400)
RBC: 3.65 MIL/uL — ABNORMAL LOW (ref 3.87–5.11)
RDW: 13 % (ref 11.5–15.5)

## 2013-06-16 LAB — COMPREHENSIVE METABOLIC PANEL
AST: 13 U/L (ref 0–37)
Alkaline Phosphatase: 76 U/L (ref 39–117)
CO2: 26 mEq/L (ref 19–32)
Chloride: 102 mEq/L (ref 96–112)
Creatinine, Ser: 0.77 mg/dL (ref 0.50–1.10)
GFR calc non Af Amer: >90 mL/min (ref >90)
Potassium: 3.8 mEq/L (ref 3.5–5.1)
Total Bilirubin: 0.3 mg/dL (ref 0.3–1.2)

## 2013-06-16 MED ORDER — IBUPROFEN 800 MG PO TABS: 800.0000 mg | ORAL_TABLET | Freq: Three times a day (TID) | ORAL | Status: DC | PRN

## 2013-06-16 MED ORDER — OXYCODONE-ACETAMINOPHEN 5-325 MG PO TABS: 1.0000 | ORAL_TABLET | ORAL | Status: DC | PRN

## 2013-06-16 NOTE — Progress Notes (Signed)
1 Day Post-Op Procedure(s) (LRB): ROBOTIC ASSISTED TOTAL HYSTERECTOMY AND RIGHT SALINGECTOMY (Right)  Subjective: Patient reports tolerating PO, + flatus and no problems voiding.    Objective: I have reviewed patient's vital signs, intake and output, medications and labs.  General: alert and no distress Resp: clear to auscultation bilaterally Cardio: regular rate and rhythm, S1, S2 normal, no murmur, click, rub or gallop GI: normal findings: soft, non-tender Extremities: extremities normal, atraumatic, no cyanosis or edema Vaginal Bleeding: none  Assessment: s/p Procedure(s): ROBOTIC ASSISTED TOTAL HYSTERECTOMY AND RIGHT SALINGECTOMY (Right): stable, progressing well, tolerating diet and ready for discharge.  Plan: Discharge home  LOS: 1 day    Stacy Berry A 06/16/2013, 8:58 AM

## 2013-06-16 NOTE — Discharge Summary (Signed)
Physician Discharge Summary  Patient ID: Stacy Berry MRN: [id number] DOB/AGE: May 08, 1971 42 y.o.  Admit date: 06/15/2013 Discharge date: 06/16/2013  Admission Diagnoses:  Symptomatic Uterine Fibroids  Discharge Diagnoses:  Same Active Problems:   * No active hospital problems. *   Discharged Condition: good  Hospital Course:  Underwent robotic assisted TLH and right salpingectomy.  No complications.  Postoperative course uncomplicated.  Discharged home in good condition.  Consults: None  Significant Diagnostic Studies: labs: CBC, CMET  Treatments: IV hydration, analgesia: Percocet, Toradol and surgery: Robotic Assisted TLH and unilateral right salpingectomy.  Discharge Exam: Blood pressure 147/77, pulse 73, temperature 97.9 F (36.6 C), temperature source Oral, resp. rate 18, height 5\' 5"  (1.651 m), weight 194 lb 6 oz (88.168 kg), last menstrual period 05/08/2013, SpO2 99.00%. General appearance: alert and no distress Resp: clear to auscultation bilaterally Chest wall: no tenderness GI: normal findings: soft, non-tender Extremities: extremities normal, atraumatic, no cyanosis or edema Incision/Wound:  C, D, I. Vagina:  No bleeding Disposition: 01-Home or Self Care  Discharge Orders   Future Orders Complete By Expires   (HEART FAILURE PATIENTS) Call MD:  Anytime you have any of the following symptoms: 1) 3 pound weight gain in 24 hours or 5 pounds in 1 week 2) shortness of breath, with or without a dry hacking cough 3) swelling in the hands, feet or stomach 4) if you have to sleep on extra pillows at night in order to breathe.  As directed    Call MD for:  difficulty breathing, headache or visual disturbances  As directed    Call MD for:  extreme fatigue  As directed    Call MD for:  hives  As directed    Call MD for:  persistant dizziness or light-headedness  As directed    Call MD for:  persistant nausea and vomiting  As directed    Call MD for:  redness, tenderness, or  signs of infection (pain, swelling, redness, odor or green/yellow discharge around incision site)  As directed    Call MD for:  severe uncontrolled pain  As directed    Call MD for:  temperature >100.4  As directed    Call MD for:  As directed    Diet - low sodium heart healthy  As directed    Discharge instructions  As directed    Comments:     Routine   Driving Restrictions  As directed    Comments:     No driving for 2 weeks.   Increase activity slowly  As directed    Lifting restrictions  As directed    Comments:     No lifting > 20 lbs.   Sexual Activity Restrictions  As directed    Comments:     No sex for 8 weeks.       Medication List         ibuprofen 800 MG tablet  Commonly known as:  ADVIL,MOTRIN  Take 1 tablet (800 mg total) by mouth every 8 (eight) hours as needed for pain.     oxyCODONE-acetaminophen 5-325 MG per tablet  Commonly known as:  PERCOCET/ROXICET  Take 1-2 tablets by mouth every 4 (four) hours as needed for pain.     PRENATAL/IRON PO  Take 1 tablet by mouth daily.     valsartan-hydrochlorothiazide 80-12.5 MG per tablet  Commonly known as:  DIOVAN-HCT  Take 1 tablet by mouth every morning.         Signed: HARPER,CHARLES  A 06/16/2013, 9:06 AM

## 2013-06-16 NOTE — Anesthesia Postprocedure Evaluation (Signed)
  Anesthesia Post-op Note  Patient: Stacy Berry  Procedure(s) Performed: Procedure(s): ROBOTIC ASSISTED TOTAL HYSTERECTOMY AND RIGHT SALINGECTOMY (Right)  Patient Location: [facility]  Anesthesia Type:General  Level of Consciousness: awake, alert  and oriented  Airway and Oxygen Therapy: Patient Spontanous Breathing  Post-op Pain: mild  Post-op Assessment: Post-op Vital signs reviewed and Patient's Cardiovascular Status Stable  Post-op Vital Signs: Reviewed and stable  Complications: No apparent anesthesia complications

## 2013-06-16 NOTE — Progress Notes (Signed)
Discharge instructions reviewed with patient.  Patient states understanding of home care, medications, activity, signs/symptoms to report to MD and return MD office visit.  No home equipment needed.  Patient discharged in stable condition via wheelchair with staff without incident. 

## 2013-06-19 ENCOUNTER — Encounter: Payer: Self-pay | Admitting: Advanced Practice Midwife

## 2013-06-19 ENCOUNTER — Ambulatory Visit (INDEPENDENT_AMBULATORY_CARE_PROVIDER_SITE_OTHER): Payer: 59 | Admitting: Advanced Practice Midwife

## 2013-06-19 VITALS — BP 143/98 | HR 79 | Temp 98.3°F | Ht 65.0 in | Wt 194.6 lb

## 2013-06-19 DIAGNOSIS — G8918 Other acute postprocedural pain: Secondary | ICD-10-CM

## 2013-06-19 MED ORDER — HYDROCODONE-ACETAMINOPHEN 5-300 MG PO TABS: 2.0000 | ORAL_TABLET | Freq: Four times a day (QID) | ORAL | Status: DC

## 2013-06-19 NOTE — Progress Notes (Signed)
.   Subjective:     Stacy Berry is a 42 y.o. female here for an incision check (robotic assisted hysterectomy on 06/15/13).  Current complaints:pt states that she has a little blood draining from her incision site.  Personal health questionnaire reviewed: yes.  Patient taking 800 mg of ibuprofen every 4 hours w/ 2 percocet. Reports her pain still worsens close to when the medication is due again.    Gynecologic History Patient's last menstrual period was 05/08/2013. Contraception: none   Obstetric History OB History  Gravida Para Term Preterm AB SAB TAB Ectopic Multiple Living  5 4 4  1   1  4     # Outcome Date GA Lbr Len/2nd Weight Sex Delivery Anes PTL Lv  5 TRM     M SVD  N Y  4 TRM     M SVD  N Y  3 TRM     M SVD  N Y  2 TRM     F SVD   Y  1 ECT              Comments: ruptured       The following portions of the patient's history were reviewed and updated as appropriate: allergies, current medications, past family history, past medical history, past social history, past surgical history and problem list.  Review of Systems A comprehensive review of systems was negative.    Objective:    BP 143/98  Pulse 79  Temp(Src) 98.3 F (36.8 C) (Oral)  Ht 5\' 5"  (1.651 m)  Wt 194 lb 9.6 oz (88.27 kg)  BMI 32.38 kg/m2  LMP 05/08/2013 General appearance: alert and cooperative Abdomen: soft, non-tender; bowel sounds normal; no masses,  no organomegaly and Incisions appear to be healing well with out Reddness, Erythema, Discharge    Assessment:   Post op s/p hysterectomy Laparoscopic Incisions Healing well Poor pain control post op  Plan:    Pain management discussed today in detail. Patient to try Vicodin instead of Percocet to see if she has better control of her pain. Encouraged patient to taper off narcotics ASAP. Encouraged patient to take medication as prescribed.  Reviewed risk of taking to much tylenol or ibuprofen. Encouraged ambulation as well as  rest. Patient to not take ibuprofen every 4 hours. Encouraged her to take 800mg  every 8 hours.  20 min spent with patient greater than 80% spent in counseling and coordination of care.    Jamiel Goncalves Wilson Singer CNM

## 2013-06-29 ENCOUNTER — Encounter: Payer: Self-pay | Admitting: Obstetrics & Gynecology

## 2013-06-29 ENCOUNTER — Ambulatory Visit (INDEPENDENT_AMBULATORY_CARE_PROVIDER_SITE_OTHER): Payer: 59 | Admitting: Obstetrics & Gynecology

## 2013-06-29 VITALS — BP 134/89 | HR 69 | Temp 98.5°F | Ht 64.5 in | Wt 192.0 lb

## 2013-06-29 DIAGNOSIS — Z09 Encounter for follow-up examination after completed treatment for conditions other than malignant neoplasm: Secondary | ICD-10-CM

## 2013-06-29 NOTE — Progress Notes (Signed)
.   Subjective:     Stacy Berry is a 42 y.o. female who presents to the clinic 2 weeks status post Robotic hysterectomy for fibroids. Eating a regular diet without difficulty. Bowel movements are Constipation. She still has a lot of pain, and is out of her pain medication.  The following portions of the patient's history were reviewed and updated as appropriate: allergies, current medications, past family history, past medical history, past social history, past surgical history and problem list.  Review of Systems Pertinent items are noted in HPI.    Objective:    BP 134/89  Pulse 69  Temp(Src) 98.5 F (36.9 C) (Oral)  Ht 5' 4.5" (1.638 m)  Wt 192 lb (87.091 kg)  BMI 32.46 kg/m2  LMP 05/08/2013 General:  alert  Abdomen: soft, bowel sounds active, non-tender  Incision:   skin adhesive still present; periumbilical incision w/a scab     Assessment:    Doing well postoperatively. Operative findings again reviewed. Pathology report discussed.    Plan:    1. Continue any current medications.  Encouraged the use of OTC analgesics prn. 2. Wound care discussed. 3. Activity restrictions: no lifting more than 5 pounds 4. Anticipated return to work: 2-3 weeks. 5. Follow up: 4 weeks .

## 2013-07-13 ENCOUNTER — Encounter: Payer: Self-pay | Admitting: Obstetrics & Gynecology

## 2013-07-13 ENCOUNTER — Encounter: Payer: Self-pay | Admitting: *Deleted

## 2013-07-13 ENCOUNTER — Ambulatory Visit (INDEPENDENT_AMBULATORY_CARE_PROVIDER_SITE_OTHER): Payer: 59 | Admitting: Obstetrics & Gynecology

## 2013-07-13 VITALS — BP 158/107 | HR 91 | Temp 98.0°F | Ht 65.0 in | Wt 187.6 lb

## 2013-07-13 DIAGNOSIS — R300 Dysuria: Secondary | ICD-10-CM

## 2013-07-13 LAB — POCT URINALYSIS DIPSTICK: pH, UA: 5

## 2013-07-13 NOTE — Progress Notes (Signed)
.   Subjective:     Stacy Berry is a 42 y.o. female who presents to the clinic 4 weeks status post TRH for fibroids. Eating a regular diet without difficulty. Bowel movements are normal. The patient is not having any pain.  Patient is having some discomfort before and during urination.   The following portions of the patient's history were reviewed and updated as appropriate: allergies, current medications, past family history, past medical history, past social history, past surgical history and problem list.  Review of Systems Pertinent items are noted in HPI.    Objective:    There were no vitals taken for this visit. General:  alert  Abdomen: soft, bowel sounds active, non-tender  Incision:   healing well, no drainage, no erythema, no hernia, no seroma, no swelling, no dehiscence, incision well approximated     Assessment:    Doing well postoperatively. Operative findings again reviewed. Pathology report discussed.    Plan:    1. Continue any current medications. 2. Wound care discussed. 3. Activity restrictions: no lifting more than 5 pounds and no intercourse 4. Anticipated return to work: now. 5. Follow up: 4 weeks

## 2013-07-15 ENCOUNTER — Encounter: Payer: Self-pay | Admitting: Obstetrics & Gynecology

## 2013-08-10 ENCOUNTER — Encounter: Payer: 59 | Admitting: Obstetrics & Gynecology

## 2013-09-28 ENCOUNTER — Other Ambulatory Visit: Payer: Self-pay | Admitting: Obstetrics & Gynecology

## 2013-09-28 DIAGNOSIS — Z1231 Encounter for screening mammogram for malignant neoplasm of breast: Secondary | ICD-10-CM

## 2013-10-14 ENCOUNTER — Ambulatory Visit (HOSPITAL_COMMUNITY)
Admission: RE | Admit: 2013-10-14 | Discharge: 2013-10-14 | Disposition: A | Discharge status: Home / Self Care | Payer: 59 | Source: Ambulatory Visit | Attending: Obstetrics & Gynecology | Admitting: Obstetrics & Gynecology

## 2013-10-14 DIAGNOSIS — Z1231 Encounter for screening mammogram for malignant neoplasm of breast: Secondary | ICD-10-CM | POA: Insufficient documentation

## 2014-06-22 ENCOUNTER — Encounter (INDEPENDENT_AMBULATORY_CARE_PROVIDER_SITE_OTHER): Payer: Self-pay | Admitting: General Surgery

## 2014-06-25 ENCOUNTER — Ambulatory Visit (INDEPENDENT_AMBULATORY_CARE_PROVIDER_SITE_OTHER): Payer: Self-pay | Admitting: Surgery

## 2014-07-16 ENCOUNTER — Ambulatory Visit (INDEPENDENT_AMBULATORY_CARE_PROVIDER_SITE_OTHER): Payer: Self-pay | Admitting: Surgery

## 2014-08-09 ENCOUNTER — Encounter: Payer: Self-pay | Admitting: Obstetrics & Gynecology

## 2014-09-24 ENCOUNTER — Other Ambulatory Visit: Payer: Self-pay | Admitting: Internal Medicine

## 2014-09-24 DIAGNOSIS — N644 Mastodynia: Secondary | ICD-10-CM

## 2014-10-04 ENCOUNTER — Encounter: Payer: Self-pay | Admitting: *Deleted

## 2014-10-05 ENCOUNTER — Encounter: Payer: Self-pay | Admitting: Obstetrics & Gynecology

## 2014-10-13 ENCOUNTER — Other Ambulatory Visit: Payer: Self-pay | Admitting: Internal Medicine

## 2014-10-13 DIAGNOSIS — Z1231 Encounter for screening mammogram for malignant neoplasm of breast: Secondary | ICD-10-CM

## 2014-10-21 ENCOUNTER — Encounter (HOSPITAL_COMMUNITY): Payer: Self-pay | Admitting: Obstetrics & Gynecology

## 2014-10-25 ENCOUNTER — Ambulatory Visit
Admission: RE | Admit: 2014-10-25 | Discharge: 2014-10-25 | Disposition: A | Discharge status: Home / Self Care | Payer: 59 | Source: Ambulatory Visit | Attending: Internal Medicine | Admitting: Internal Medicine

## 2014-10-25 DIAGNOSIS — Z1231 Encounter for screening mammogram for malignant neoplasm of breast: Secondary | ICD-10-CM

## 2014-11-07 ENCOUNTER — Emergency Department (INDEPENDENT_AMBULATORY_CARE_PROVIDER_SITE_OTHER)
Admission: EM | Admit: 2014-11-07 | Discharge: 2014-11-07 | Disposition: A | Discharge status: Home / Self Care | Payer: 59 | Source: Home / Self Care | Attending: Family Medicine | Admitting: Family Medicine

## 2014-11-07 ENCOUNTER — Encounter (HOSPITAL_COMMUNITY): Payer: Self-pay | Admitting: *Deleted

## 2014-11-07 DIAGNOSIS — J0100 Acute maxillary sinusitis, unspecified: Secondary | ICD-10-CM | POA: Diagnosis not present

## 2014-11-07 DIAGNOSIS — J309 Allergic rhinitis, unspecified: Secondary | ICD-10-CM

## 2014-11-07 HISTORY — DX: Unspecified ectopic pregnancy without intrauterine pregnancy: O00.90

## 2014-11-07 MED ORDER — DEXAMETHASONE SODIUM PHOSPHATE 10 MG/ML IJ SOLN
10.0000 mg | Freq: Once | INTRAMUSCULAR | Status: AC
  Administered 2014-11-07: 10 mg via INTRAMUSCULAR

## 2014-11-07 MED ORDER — AMOXICILLIN-POT CLAVULANATE ER 1000-62.5 MG PO TB12: 2.0000 | ORAL_TABLET | Freq: Two times a day (BID) | ORAL | Status: DC

## 2014-11-07 MED ORDER — DEXAMETHASONE SODIUM PHOSPHATE 10 MG/ML IJ SOLN
INTRAMUSCULAR | Status: AC
  Filled 2014-11-07: qty 1

## 2014-11-07 MED ORDER — LORATADINE 10 MG PO TABS: 10.0000 mg | ORAL_TABLET | Freq: Every day | ORAL | Status: DC

## 2014-11-07 MED ORDER — IPRATROPIUM BROMIDE 0.06 % NA SOLN: 2.0000 | Freq: Four times a day (QID) | NASAL | Status: DC

## 2014-11-07 NOTE — ED Provider Notes (Signed)
CSN: [id number]     Arrival date & time 11/07/14  1323 History   First MD Initiated Contact with Patient 11/07/14 1343     Chief Complaint  Patient presents with  . Facial Pain   (Consider location/radiation/quality/duration/timing/severity/associated sxs/prior Treatment) HPI Comments: PCP: Gomez Cleverly Used to take daily immunotherapy for chronic allergic rhinitis and environmental allergies. Quit therapy 1 year ago.   Patient is a 44 y.o. female presenting with URI. The history is provided by the patient.  URI Presenting symptoms: congestion, ear pain, facial pain and rhinorrhea   Presenting symptoms: no fatigue, no fever and no sore throat   Severity:  Severe Onset quality:  Gradual Duration:  10 days Timing:  Constant Progression:  Worsening Ineffective treatments:  Decongestant and OTC medications (OTC antihistamines) Associated symptoms: headaches and sinus pain   Associated symptoms comment:  Watery eyes   Past Medical History  Diagnosis Date  . Hypertension   . Hyperlipidemia   . Infection     UTI  . Fibroid   . Ectopic pregnancy    Past Surgical History  Procedure Laterality Date  . Ectopic pregnancy surgery    . Endometrial ablation    . [place]. 2000  . Robotic assisted total hysterectomy Right 06/15/2013    Procedure: ROBOTIC ASSISTED TOTAL HYSTERECTOMY AND RIGHT SALINGECTOMY;  Surgeon: Lahoma Crocker, MD;  Location: [facility];  Service: Gynecology;  Laterality: Right;  . Abdominal hysterectomy     Family History  Problem Relation Age of Onset  . Cancer Maternal Grandmother     breast  . Cancer Paternal Grandfather    History  Substance Use Topics  . Smoking status: Former Smoker -- 0.01 packs/day for 10 years  . Smokeless tobacco: Never Used  . Alcohol Use: Yes     Comment: occasional   OB History    Gravida Para Term Preterm AB TAB SAB Ectopic Multiple Living   5 4 4  1   1  4      Review of Systems   Constitutional: Negative for fever and fatigue.  HENT: Positive for congestion, ear pain and rhinorrhea. Negative for sore throat.   Neurological: Positive for headaches.  All other systems reviewed and are negative.   Allergies  Cocoa  Home Medications   Prior to Admission medications   Medication Sig Start Date End Date Taking? Authorizing Provider  ibuprofen (ADVIL,MOTRIN) 800 MG tablet Take 1 tablet (800 mg total) by mouth every 8 (eight) hours as needed for pain. 06/16/13  Yes Shelly Bombard, MD  LISINOPRIL-HYDROCHLOROTHIAZIDE PO Take by mouth.   Yes Historical Provider, MD  amoxicillin-clavulanate (AUGMENTIN XR) 1000-62.5 MG per tablet Take 2 tablets by mouth 2 (two) times daily. X 5 days 11/07/14   Lutricia Feil, PA  Hydrocodone-Acetaminophen (VICODIN) 5-300 MG TABS Take 2 tablets by mouth every 6 (six) hours. 06/19/13   Amy Thereasa Parkin, CNM  ipratropium (ATROVENT) 0.06 % nasal spray Place 2 sprays into both nostrils 4 (four) times daily. 11/07/14   Audelia Hives Rogue Pautler, PA  loratadine (CLARITIN) 10 MG tablet Take 1 tablet (10 mg total) by mouth daily. 11/07/14   Lutricia Feil, PA  Prenatal Multivit-Min-Fe-FA (PRENATAL/IRON PO) Take 1 tablet by mouth daily.     Historical Provider, MD  valsartan-hydrochlorothiazide (DIOVAN-HCT) 80-12.5 MG per tablet Take 1 tablet by mouth every morning.    Historical Provider, MD   BP 173/114 mmHg  Pulse 90  Temp(Src) 97.4 F (  36.3 C) (Oral)  Resp 16  SpO2 96%  LMP 05/08/2013 Physical Exam  Constitutional: She is oriented to person, place, and time. She appears well-developed and well-nourished. No distress.  HENT:  Head: Normocephalic and atraumatic.  Right Ear: External ear and ear canal normal. Tympanic membrane is not injected, not perforated, not erythematous, not retracted and not bulging. A middle ear effusion is present. Decreased hearing is noted.  Left Ear: External ear and ear canal normal. Tympanic membrane is  not injected, not perforated, not erythematous, not retracted and not bulging. A middle ear effusion is present. Decreased hearing is noted.  Nose: Mucosal edema and rhinorrhea present. Right sinus exhibits maxillary sinus tenderness and frontal sinus tenderness. Left sinus exhibits maxillary sinus tenderness and frontal sinus tenderness.  Mouth/Throat: Uvula is midline, oropharynx is clear and moist and mucous membranes are normal.  +purulent rhinorrhea  Eyes: Conjunctivae are normal. No scleral icterus.  Neck: Normal range of motion. Neck supple.  Cardiovascular: Normal rate, regular rhythm and normal heart sounds.   Pulmonary/Chest: Effort normal and breath sounds normal. No respiratory distress. She has no wheezes.  Musculoskeletal: Normal range of motion.  Lymphadenopathy:    She has no cervical adenopathy.  Neurological: She is alert and oriented to person, place, and time.  Skin: Skin is warm and dry.  Psychiatric: She has a normal mood and affect. Her behavior is normal.  Nursing note and vitals reviewed.   ED Course  Procedures (including critical care time) Labs Review Labs Reviewed - No data to display  Imaging Review No results found.   MDM   1. Allergic rhinitis, unspecified allergic rhinitis type   2. Acute maxillary sinusitis, recurrence not specified    Patient has attempted 10 days of antihistamines and decongestants at home with little relief. Expresses concern that symptoms are not improving and that she has developed a secondary sinusitis with development of headache and facial pain over past few days. I am concerned that continued use os oral decongestants will continue to cause elevated blood pressure.  Will provide 10 mg Dexamethasone injection here at [facility] to attempt to quiet allergic component to her symptoms and treat at home with oral Claritin, nasal Atrovent and 5 day course of Augmentin. Advise PCP follow up if no improvement.     Lutricia Feil, [place] 11/07/14 [phone]

## 2014-11-07 NOTE — ED Notes (Signed)
C/O bilat earache - L>R, burning across maxillary sinuses and watering eyes x 1 wk.  Has been taking Benadryl and Sudafed without relief.  Has not taken HTN med x 1 wk "while I'm taking all of this med for my sinuses".

## 2014-11-07 NOTE — Discharge Instructions (Signed)
Allergic Rhinitis Allergic rhinitis is when the mucous membranes in the nose respond to allergens. Allergens are particles in the air that cause your body to have an allergic reaction. This causes you to release allergic antibodies. Through a chain of events, these eventually cause you to release histamine into the blood stream. Although meant to protect the body, it is this release of histamine that causes your discomfort, such as frequent sneezing, congestion, and an itchy, runny nose.  CAUSES  Seasonal allergic rhinitis (hay fever) is caused by pollen allergens that may come from grasses, trees, and weeds. Year-round allergic rhinitis (perennial allergic rhinitis) is caused by allergens such as house dust mites, pet dander, and mold spores.  SYMPTOMS   Nasal stuffiness (congestion).  Itchy, runny nose with sneezing and tearing of the eyes. DIAGNOSIS  Your health care provider can help you determine the allergen or allergens that trigger your symptoms. If you and your health care provider are unable to determine the allergen, skin or blood testing may be used. TREATMENT  Allergic rhinitis does not have a cure, but it can be controlled by:  Medicines and allergy shots (immunotherapy).  Avoiding the allergen. Hay fever may often be treated with antihistamines in pill or nasal spray forms. Antihistamines block the effects of histamine. There are over-the-counter medicines that may help with nasal congestion and swelling around the eyes. Check with your health care provider before taking or giving this medicine.  If avoiding the allergen or the medicine prescribed do not work, there are many new medicines your health care provider can prescribe. Stronger medicine may be used if initial measures are ineffective. Desensitizing injections can be used if medicine and avoidance does not work. Desensitization is when a patient is given ongoing shots until the body becomes less sensitive to the allergen.  Make sure you follow up with your health care provider if problems continue. HOME CARE INSTRUCTIONS It is not possible to completely avoid allergens, but you can reduce your symptoms by taking steps to limit your exposure to them. It helps to know exactly what you are allergic to so that you can avoid your specific triggers. SEEK MEDICAL CARE IF:   You have a fever.  You develop a cough that does not stop easily (persistent).  You have shortness of breath.  You start wheezing.  Symptoms interfere with normal daily activities. Document Released: 06/19/2001 Document Revised: 09/29/2013 Document Reviewed: 06/01/2013 [facility] Patient Information 2015 [place], [place]. This information is not intended to replace advice given to you by your health care provider. Make sure you discuss any questions you have with your health care provider.  Sinusitis Sinusitis is redness, soreness, and inflammation of the paranasal sinuses. Paranasal sinuses are air pockets within the bones of your face (beneath the eyes, the middle of the forehead, or above the eyes). In healthy paranasal sinuses, mucus is able to drain out, and air is able to circulate through them by way of your nose. However, when your paranasal sinuses are inflamed, mucus and air can become trapped. This can allow bacteria and other germs to grow and cause infection. Sinusitis can develop quickly and last only a short time (acute) or continue over a long period (chronic). Sinusitis that lasts for more than 12 weeks is considered chronic.  CAUSES  Causes of sinusitis include:  Allergies.  Structural abnormalities, such as displacement of the cartilage that separates your nostrils (deviated septum), which can decrease the air flow through your nose and sinuses and affect  sinus drainage.  Functional abnormalities, such as when the small hairs (cilia) that line your sinuses and help remove mucus do not work properly or are not present. SIGNS AND  SYMPTOMS  Symptoms of acute and chronic sinusitis are the same. The primary symptoms are pain and pressure around the affected sinuses. Other symptoms include:  Upper toothache.  Earache.  Headache.  Bad breath.  Decreased sense of smell and taste.  A cough, which worsens when you are lying flat.  Fatigue.  Fever.  Thick drainage from your nose, which often is green and may contain pus (purulent).  Swelling and warmth over the affected sinuses. DIAGNOSIS  Your health care provider will perform a physical exam. During the exam, your health care provider may:  Look in your nose for signs of abnormal growths in your nostrils (nasal polyps).  Tap over the affected sinus to check for signs of infection.  View the inside of your sinuses (endoscopy) using an imaging device that has a light attached (endoscope). If your health care provider suspects that you have chronic sinusitis, one or more of the following tests may be recommended:  Allergy tests.  Nasal culture. A sample of mucus is taken from your nose, sent to a lab, and screened for bacteria.  Nasal cytology. A sample of mucus is taken from your nose and examined by your health care provider to determine if your sinusitis is related to an allergy. TREATMENT  Most cases of acute sinusitis are related to a viral infection and will resolve on their own within 10 days. Sometimes medicines are prescribed to help relieve symptoms (pain medicine, decongestants, nasal steroid sprays, or saline sprays).  However, for sinusitis related to a bacterial infection, your health care provider will prescribe antibiotic medicines. These are medicines that will help kill the bacteria causing the infection.  Rarely, sinusitis is caused by a fungal infection. In theses cases, your health care provider will prescribe antifungal medicine. For some cases of chronic sinusitis, surgery is needed. Generally, these are cases in which sinusitis recurs  more than 3 times per year, despite other treatments. HOME CARE INSTRUCTIONS   Drink plenty of water. Water helps thin the mucus so your sinuses can drain more easily.  Use a humidifier.  Inhale steam 3 to 4 times a day (for example, sit in the bathroom with the shower running).  Apply a warm, moist washcloth to your face 3 to 4 times a day, or as directed by your health care provider.  Use saline nasal sprays to help moisten and clean your sinuses.  Take medicines only as directed by your health care provider.  If you were prescribed either an antibiotic or antifungal medicine, finish it all even if you start to feel better. SEEK IMMEDIATE MEDICAL CARE IF:  You have increasing pain or severe headaches.  You have nausea, vomiting, or drowsiness.  You have swelling around your face.  You have vision problems.  You have a stiff neck.  You have difficulty breathing. MAKE SURE YOU:   Understand these instructions.  Will watch your condition.  Will get help right away if you are not doing well or get worse. Document Released: 09/24/2005 Document Revised: 02/08/2014 Document Reviewed: 10/09/2011 [facility] Patient Information 2015 [place], [place]. This information is not intended to replace advice given to you by your health care provider. Make sure you discuss any questions you have with your health care provider.

## 2015-03-17 ENCOUNTER — Encounter (HOSPITAL_COMMUNITY): Payer: Self-pay | Admitting: Obstetrics & Gynecology

## 2016-10-04 ENCOUNTER — Encounter (HOSPITAL_COMMUNITY): Payer: Self-pay | Admitting: Family Medicine

## 2016-10-04 ENCOUNTER — Ambulatory Visit (HOSPITAL_COMMUNITY)
Admission: EM | Admit: 2016-10-04 | Discharge: 2016-10-04 | Disposition: A | Discharge status: Home / Self Care | Payer: 59 | Attending: Family Medicine | Admitting: Family Medicine

## 2016-10-04 DIAGNOSIS — J209 Acute bronchitis, unspecified: Secondary | ICD-10-CM | POA: Diagnosis not present

## 2016-10-04 MED ORDER — CEFDINIR 300 MG PO CAPS: 300.0000 mg | ORAL_CAPSULE | Freq: Two times a day (BID) | ORAL | 0 refills | Status: DC

## 2016-10-04 MED ORDER — GUAIFENESIN-CODEINE 100-10 MG/5ML PO SYRP: 10.0000 mL | ORAL_SOLUTION | Freq: Four times a day (QID) | ORAL | 0 refills | Status: DC | PRN

## 2016-10-04 MED ORDER — IPRATROPIUM BROMIDE 0.06 % NA SOLN: 2.0000 | Freq: Four times a day (QID) | NASAL | 1 refills | Status: DC

## 2016-10-04 NOTE — ED Provider Notes (Signed)
[facility]    CSN: [id number] Arrival date & time: 10/04/16  1826     History   Chief Complaint Chief Complaint  Patient presents with  . Cough  . Nasal Congestion    HPI Stacy Berry is a 45 y.o. female.   The history is provided by the patient and the spouse.  Cough  Cough characteristics:  Productive Sputum characteristics:  Yellow and green Severity:  Moderate Onset quality:  Gradual Duration:  2 weeks Progression:  Unchanged Chronicity:  New Smoker: yes   Context: sick contacts, smoke exposure, upper respiratory infection and weather changes   Relieved by:  Nothing Worsened by:  Smoking Associated symptoms: rhinorrhea   Associated symptoms: no sore throat and no wheezing     Past Medical History:  Diagnosis Date  . Ectopic pregnancy   . Fibroid   . Hyperlipidemia   . Hypertension   . Infection    UTI    Patient Active Problem List   Diagnosis Date Noted  . 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 Leiomyoma of uterus, unspecified 05/11/2013  . Lipoma of abdominal wall 09/03/2012    Past Surgical History:  Procedure Laterality Date  . ABDOMINAL HYSTERECTOMY    . ECTOPIC PREGNANCY SURGERY    . ENDOMETRIAL ABLATION    . ROBOTIC ASSISTED TOTAL HYSTERECTOMY Right 06/15/2013   Procedure: ROBOTIC ASSISTED TOTAL HYSTERECTOMY AND RIGHT SALINGECTOMY;  Surgeon: Lahoma Crocker, MD;  Location: [facility];  Service: Gynecology;  Laterality: Right;  . [place]    OB History    Gravida Para Term Preterm AB Living   5 4 4   1 4    SAB TAB Ectopic Multiple Live Births       1   4       Home Medications    Prior to Admission medications   Medication Sig Start Date End Date Taking? Authorizing Provider  amoxicillin-clavulanate (AUGMENTIN XR) 1000-62.5 MG per tablet Take 2 tablets by mouth 2 (two) times daily. X 5 days 11/07/14   Lutricia Feil, PA  Hydrocodone-Acetaminophen (VICODIN) 5-300 MG  TABS Take 2 tablets by mouth every 6 (six) hours. 06/19/13   Amy H Wren, CNM  ibuprofen (ADVIL,MOTRIN) 800 MG tablet Take 1 tablet (800 mg total) by mouth every 8 (eight) hours as needed for pain. 06/16/13   Shelly Bombard, MD  ipratropium (ATROVENT) 0.06 % nasal spray Place 2 sprays into both nostrils 4 (four) times daily. 11/07/14   Audelia Hives Presson, PA  LISINOPRIL-HYDROCHLOROTHIAZIDE PO Take by mouth.    Historical Provider, MD  loratadine (CLARITIN) 10 MG tablet Take 1 tablet (10 mg total) by mouth daily. 11/07/14   Lutricia Feil, PA  Prenatal Multivit-Min-Fe-FA (PRENATAL/IRON PO) Take 1 tablet by mouth daily.     Historical Provider, MD  valsartan-hydrochlorothiazide (DIOVAN-HCT) 80-12.5 MG per tablet Take 1 tablet by mouth every morning.    Historical Provider, MD    Family History Family History  Problem Relation Age of Onset  . Cancer Paternal Grandfather   . Cancer Maternal Grandmother     breast    Social History Social History  Substance Use Topics  . Smoking status: Former Smoker    Packs/day: 0.01    Years: 10.00  . Smokeless tobacco: Never Used  . Alcohol use Yes     Comment: occasional     Allergies   Cocoa   Review of Systems  Review of Systems  Constitutional: Negative.   HENT: Positive for congestion, postnasal drip and rhinorrhea. Negative for sore throat.   Respiratory: Positive for cough. Negative for wheezing.   Cardiovascular: Negative.   All other systems reviewed and are negative.    Physical Exam Triage Vital Signs ED Triage Vitals [10/04/16 1853]  Enc Vitals Group     BP (!) 176/110     Pulse Rate 101     Resp 18     Temp 98.6 F (37 C)     Temp src      SpO2 98 %     Weight      Height      Head Circumference      Peak Flow      Pain Score      Pain Loc      Pain Edu?      Excl. in [place]?    No data found.   Updated Vital Signs BP (!) 176/110   Pulse 101   Temp 98.6 F (37 C)   Resp 18   LMP 05/29/2013   SpO2  98%   Visual Acuity Right Eye Distance:   Left Eye Distance:   Bilateral Distance:    Right Eye Near:   Left Eye Near:    Bilateral Near:     Physical Exam  Constitutional: She is oriented to person, place, and time. She appears well-developed and well-nourished.  HENT:  Mouth/Throat: Oropharynx is clear and moist.  Eyes: Pupils are equal, round, and reactive to light.  Neck: Normal range of motion. Neck supple.  Cardiovascular: Normal rate and regular rhythm.   Pulmonary/Chest: Effort normal and breath sounds normal.  Lymphadenopathy:    She has no cervical adenopathy.  Neurological: She is alert and oriented to person, place, and time.  Skin: Skin is warm and dry.  Nursing note and vitals reviewed.    UC Treatments / Results  Labs (all labs ordered are listed, but only abnormal results are displayed) Labs Reviewed - No data to display  EKG  EKG Interpretation None       Radiology No results found.  Procedures Procedures (including critical care time)  Medications Ordered in UC Medications - No data to display   Initial Impression / Assessment and Plan / UC Course  I have reviewed the triage vital signs and the nursing notes.  Pertinent labs & imaging results that were available during my care of the patient were reviewed by me and considered in my medical decision making (see chart for details).  Clinical Course       Final Clinical Impressions(s) / UC Diagnoses   Final diagnoses:  None    New Prescriptions New Prescriptions   No medications on file     Billy Fischer, MD 10/04/16 2057

## 2016-10-04 NOTE — ED Triage Notes (Signed)
Pt here for URI symptoms. sts for a few days and her husband has been sick.

## 2016-10-04 NOTE — Discharge Instructions (Signed)
Take all of medicine, drink lots of fluids, no more smoking, see your doctor if further problems  °

## 2016-10-05 MED FILL — IPRATROPIUM 0.06% SPRAY: 0.06 | 10 days supply | Qty: 15 | Fill #0

## 2016-10-05 MED FILL — GUAIATUSSIN AC LIQUID: 100-10 | 4 days supply | Qty: 180 | Fill #0

## 2016-10-05 MED FILL — CEFDINIR 300 MG CAPSULE: 300 | 10 days supply | Qty: 20 | Fill #0

## 2017-04-05 DIAGNOSIS — C8590 Non-Hodgkin lymphoma, unspecified, unspecified site: Secondary | ICD-10-CM | POA: Diagnosis not present

## 2017-04-05 DIAGNOSIS — M79605 Pain in left leg: Secondary | ICD-10-CM | POA: Diagnosis not present

## 2017-04-05 DIAGNOSIS — F17200 Nicotine dependence, unspecified, uncomplicated: Secondary | ICD-10-CM | POA: Diagnosis not present

## 2017-04-05 DIAGNOSIS — M545 Low back pain: Secondary | ICD-10-CM | POA: Diagnosis not present

## 2017-04-05 DIAGNOSIS — M79662 Pain in left lower leg: Secondary | ICD-10-CM | POA: Diagnosis not present

## 2017-04-09 MED FILL — IBUPROFEN 600 MG TABLET: 600 | 7 days supply | Qty: 21 | Fill #0

## 2017-04-09 MED FILL — CYCLOBENZAPRINE 10 MG TAB: 10 | 7 days supply | Qty: 14 | Fill #0

## 2017-06-12 ENCOUNTER — Ambulatory Visit: Payer: 59 | Admitting: Family Medicine

## 2017-11-15 DIAGNOSIS — F17200 Nicotine dependence, unspecified, uncomplicated: Secondary | ICD-10-CM | POA: Diagnosis not present

## 2017-11-15 DIAGNOSIS — H1045 Other chronic allergic conjunctivitis: Secondary | ICD-10-CM | POA: Diagnosis not present

## 2017-11-15 DIAGNOSIS — R05 Cough: Secondary | ICD-10-CM | POA: Diagnosis not present

## 2017-11-15 DIAGNOSIS — J309 Allergic rhinitis, unspecified: Secondary | ICD-10-CM | POA: Diagnosis not present

## 2017-11-15 MED FILL — AZELASTINE HCL 0.05% DROPS: 0.05 | 30 days supply | Qty: 6 | Fill #0

## 2017-11-15 MED FILL — PROAIR RESPICLICK INHAL PWD: 108 (90 BAS | 17 days supply | Qty: 1 | Fill #0

## 2017-11-15 MED FILL — AZELASTINE HCL 137 MCG SPRY: 0.1 | 25 days supply | Qty: 30 | Fill #0

## 2017-11-15 MED FILL — MONTELUKAST SOD 10 MG TAB: 10 | 30 days supply | Qty: 30 | Fill #0 | Status: TO

## 2017-11-15 MED FILL — LEVOCETIRIZINE 5 MG TABLET: 5 | 30 days supply | Qty: 30 | Fill #0

## 2017-11-15 MED FILL — FLUTICASONE PROP 50 MCG SPR: 50 | 60 days supply | Qty: 16 | Fill #0

## 2017-12-04 DIAGNOSIS — Z9109 Other allergy status, other than to drugs and biological substances: Secondary | ICD-10-CM | POA: Diagnosis not present

## 2017-12-04 DIAGNOSIS — F17200 Nicotine dependence, unspecified, uncomplicated: Secondary | ICD-10-CM | POA: Diagnosis not present

## 2017-12-04 DIAGNOSIS — I10 Essential (primary) hypertension: Secondary | ICD-10-CM | POA: Diagnosis not present

## 2018-01-10 ENCOUNTER — Other Ambulatory Visit: Payer: Self-pay | Admitting: Internal Medicine

## 2018-01-10 ENCOUNTER — Ambulatory Visit
Admission: RE | Admit: 2018-01-10 | Discharge: 2018-01-10 | Disposition: A | Discharge status: Home / Self Care | Payer: 59 | Source: Ambulatory Visit | Attending: Internal Medicine | Admitting: Internal Medicine

## 2018-01-10 DIAGNOSIS — Z1231 Encounter for screening mammogram for malignant neoplasm of breast: Secondary | ICD-10-CM

## 2018-01-13 MED FILL — LEVOCETIRIZINE 5 MG TABLET: 5 | 30 days supply | Qty: 30 | Fill #1 | Status: TO

## 2018-02-13 DIAGNOSIS — Z90710 Acquired absence of both cervix and uterus: Secondary | ICD-10-CM | POA: Diagnosis not present

## 2018-02-13 DIAGNOSIS — I10 Essential (primary) hypertension: Secondary | ICD-10-CM | POA: Diagnosis not present

## 2018-02-13 DIAGNOSIS — R8290 Unspecified abnormal findings in urine: Secondary | ICD-10-CM | POA: Diagnosis not present

## 2018-02-13 DIAGNOSIS — Z01419 Encounter for gynecological examination (general) (routine) without abnormal findings: Secondary | ICD-10-CM | POA: Diagnosis not present

## 2018-02-18 MED FILL — MONTELUKAST SOD 10 MG TAB: 10 | 30 days supply | Qty: 30 | Fill #0

## 2018-02-18 MED FILL — LEVOCETIRIZINE 5 MG TABLET: 5 | 60 days supply | Qty: 60 | Fill #0

## 2018-02-18 MED FILL — AMLODIPINE BESYLATE 5 MG TA: 5 | 30 days supply | Qty: 30 | Fill #0

## 2018-03-04 DIAGNOSIS — I10 Essential (primary) hypertension: Secondary | ICD-10-CM | POA: Insufficient documentation

## 2018-04-28 MED FILL — MONTELUKAST SOD 10 MG TAB: 10 | 30 days supply | Qty: 30 | Fill #1

## 2018-06-19 MED FILL — MONTELUKAST SOD 10 MG TAB: 10 | 30 days supply | Qty: 30 | Fill #2

## 2018-06-20 MED FILL — LEVOCETIRIZINE 5 MG TABLET: 5 | 60 days supply | Qty: 60 | Fill #0

## 2018-10-03 MED FILL — LEVOCETIRIZINE 5 MG TABLET: 5 | 30 days supply | Qty: 30 | Fill #0

## 2018-10-03 MED FILL — MONTELUKAST SOD 10 MG TAB: 10 | 30 days supply | Qty: 30 | Fill #0

## 2018-11-24 MED FILL — LEVOCETIRIZINE 5 MG TABLET: 5 | 30 days supply | Qty: 30 | Fill #0 | Status: TO

## 2018-11-24 MED FILL — MONTELUKAST SOD 10 MG TAB: 10 | 30 days supply | Qty: 30 | Fill #0 | Status: TO

## 2019-01-07 MED FILL — LEVOCETIRIZINE 5 MG TABLET: 5 | 60 days supply | Qty: 60 | Fill #0

## 2019-01-07 MED FILL — MONTELUKAST SOD 10 MG TAB: 10 | 60 days supply | Qty: 60 | Fill #0

## 2019-02-09 DIAGNOSIS — J3089 Other allergic rhinitis: Secondary | ICD-10-CM | POA: Diagnosis not present

## 2019-02-09 DIAGNOSIS — H1045 Other chronic allergic conjunctivitis: Secondary | ICD-10-CM | POA: Diagnosis not present

## 2019-02-09 DIAGNOSIS — R05 Cough: Secondary | ICD-10-CM | POA: Diagnosis not present

## 2019-02-09 DIAGNOSIS — F17200 Nicotine dependence, unspecified, uncomplicated: Secondary | ICD-10-CM | POA: Diagnosis not present

## 2019-02-09 MED FILL — AZELASTINE HCL 137 MCG SPRY: 0.1 | 25 days supply | Qty: 30 | Fill #0

## 2019-02-09 MED FILL — FLUTICASONE PROP 50 MCG SPR: 50 | 30 days supply | Qty: 16 | Fill #0

## 2019-02-09 MED FILL — PROAIR RESPICLICK INHAL PWD: 108 (90 BAS | 17 days supply | Qty: 1 | Fill #0

## 2019-03-01 DIAGNOSIS — R51 Headache: Secondary | ICD-10-CM | POA: Diagnosis not present

## 2019-03-01 DIAGNOSIS — Z79899 Other long term (current) drug therapy: Secondary | ICD-10-CM | POA: Diagnosis not present

## 2019-03-01 DIAGNOSIS — Z91018 Allergy to other foods: Secondary | ICD-10-CM | POA: Diagnosis not present

## 2019-03-01 DIAGNOSIS — R079 Chest pain, unspecified: Secondary | ICD-10-CM | POA: Diagnosis not present

## 2019-03-01 DIAGNOSIS — R9431 Abnormal electrocardiogram [ECG] [EKG]: Secondary | ICD-10-CM | POA: Diagnosis not present

## 2019-03-01 DIAGNOSIS — I119 Hypertensive heart disease without heart failure: Secondary | ICD-10-CM | POA: Diagnosis not present

## 2019-03-01 DIAGNOSIS — R0789 Other chest pain: Secondary | ICD-10-CM | POA: Diagnosis not present

## 2019-03-01 DIAGNOSIS — I160 Hypertensive urgency: Secondary | ICD-10-CM | POA: Diagnosis not present

## 2019-03-01 DIAGNOSIS — Z9114 Patient's other noncompliance with medication regimen: Secondary | ICD-10-CM | POA: Diagnosis not present

## 2019-03-01 DIAGNOSIS — I517 Cardiomegaly: Secondary | ICD-10-CM | POA: Diagnosis not present

## 2019-03-01 DIAGNOSIS — Z87891 Personal history of nicotine dependence: Secondary | ICD-10-CM | POA: Diagnosis not present

## 2019-03-01 DIAGNOSIS — M542 Cervicalgia: Secondary | ICD-10-CM | POA: Diagnosis not present

## 2019-03-01 DIAGNOSIS — I10 Essential (primary) hypertension: Secondary | ICD-10-CM | POA: Diagnosis not present

## 2019-03-02 ENCOUNTER — Telehealth: Payer: Self-pay | Admitting: Neurology

## 2019-03-02 DIAGNOSIS — R079 Chest pain, unspecified: Secondary | ICD-10-CM | POA: Diagnosis not present

## 2019-03-02 DIAGNOSIS — R51 Headache: Secondary | ICD-10-CM | POA: Diagnosis not present

## 2019-03-02 DIAGNOSIS — Z87891 Personal history of nicotine dependence: Secondary | ICD-10-CM | POA: Diagnosis not present

## 2019-03-02 DIAGNOSIS — I119 Hypertensive heart disease without heart failure: Secondary | ICD-10-CM | POA: Diagnosis not present

## 2019-03-02 DIAGNOSIS — Z79899 Other long term (current) drug therapy: Secondary | ICD-10-CM | POA: Diagnosis not present

## 2019-03-02 DIAGNOSIS — R9431 Abnormal electrocardiogram [ECG] [EKG]: Secondary | ICD-10-CM | POA: Diagnosis not present

## 2019-03-02 DIAGNOSIS — M542 Cervicalgia: Secondary | ICD-10-CM | POA: Diagnosis not present

## 2019-03-02 DIAGNOSIS — Z91018 Allergy to other foods: Secondary | ICD-10-CM | POA: Diagnosis not present

## 2019-03-02 DIAGNOSIS — R0789 Other chest pain: Secondary | ICD-10-CM | POA: Diagnosis not present

## 2019-03-02 DIAGNOSIS — I160 Hypertensive urgency: Secondary | ICD-10-CM | POA: Diagnosis not present

## 2019-03-02 DIAGNOSIS — Z9114 Patient's other noncompliance with medication regimen: Secondary | ICD-10-CM | POA: Diagnosis not present

## 2019-03-02 MED ORDER — GENERIC EXTERNAL MEDICATION
Status: DC
Start: ? — End: 2019-03-02

## 2019-03-02 MED ORDER — SODIUM CHLORIDE 0.9 % IV SOLN
10.00 | INTRAVENOUS | Status: DC
Start: ? — End: 2019-03-02

## 2019-03-02 NOTE — Telephone Encounter (Signed)
Patient paged the on-call says she is a patient of Dr. Leonie Man, I have no record of her being seen in our office. She reports it was many years ago. Unfortunately she has to follow up with primary care. She said she is fine, she will call primary care, she was [place] urgency, I advised if she has worsening symptoms or anything concerning or headache continues with fever or vision loss or new focal neuro deficits to return to ED.

## 2019-03-05 MED FILL — AMLODIPINE BESYLATE 5 MG TA: 5 | 30 days supply | Qty: 30 | Fill #0

## 2019-04-23 ENCOUNTER — Telehealth: Payer: Self-pay

## 2019-04-23 NOTE — Telephone Encounter (Signed)
Patient wanted to est care at your location

## 2019-04-23 NOTE — Telephone Encounter (Signed)
Copied from [place] [phone]. Topic: General - Other >> Apr 23, 2019  2:56 PM Keene Breath wrote: Reason for CRM: Patient called to schedule a new patient appt.  CB# [phone]

## 2019-04-24 NOTE — Telephone Encounter (Signed)
Pt is calling and would like to est new pt appt with cory. Kim quick referred the patient

## 2019-04-30 ENCOUNTER — Telehealth: Payer: Self-pay | Admitting: *Deleted

## 2019-04-30 ENCOUNTER — Ambulatory Visit: Payer: 59 | Admitting: Adult Health

## 2019-04-30 NOTE — Telephone Encounter (Signed)
Spoke to the pt.  She works [employer] to [employer] and fell asleep waiting on invitation to appointment.  Appt moved.  Nothing further needed.

## 2019-04-30 NOTE — Telephone Encounter (Signed)
Copied from [place] [phone]. Topic: Appointment Scheduling - Scheduling Inquiry for Clinic >> Apr 30, 2019  2:24 PM Nils Flack wrote: Reason for CRM: pt missed call, please call back to r/s No answer form office

## 2019-05-05 ENCOUNTER — Encounter: Payer: Self-pay | Admitting: Adult Health

## 2019-05-05 ENCOUNTER — Ambulatory Visit (INDEPENDENT_AMBULATORY_CARE_PROVIDER_SITE_OTHER): Payer: 59 | Admitting: Adult Health

## 2019-05-05 ENCOUNTER — Other Ambulatory Visit: Payer: Self-pay

## 2019-05-05 DIAGNOSIS — Z7689 Persons encountering health services in other specified circumstances: Secondary | ICD-10-CM

## 2019-05-05 DIAGNOSIS — I10 Essential (primary) hypertension: Secondary | ICD-10-CM

## 2019-05-05 DIAGNOSIS — Z1211 Encounter for screening for malignant neoplasm of colon: Secondary | ICD-10-CM

## 2019-05-05 MED ORDER — AMLODIPINE BESYLATE 5 MG PO TABS: 5.0000 mg | ORAL_TABLET | Freq: Every day | ORAL | 3 refills | Status: DC

## 2019-05-05 MED FILL — AMLODIPINE BESYLATE 5 MG TA: 5 | 90 days supply | Qty: 90 | Fill #0

## 2019-05-05 NOTE — Progress Notes (Signed)
Virtual Visit via Video Note  I connected withLeslie A Berry on 05/05/19 at  8:00 AM EDT by a video enabled telemedicine application and verified that I am speaking with the correct person using two identifiers.  Location patient: home Location provider:work or home office Persons participating in the virtual visit: patient, provider  I discussed the limitations of evaluation and management by telemedicine and the availability of in person appointments. The patient expressed understanding and agreed to proceed.   Patient presents to clinic today to establish care.  Acute Concerns: Establish Care   Chronic Issues: Essential Hypertension - Was seen in the ER in May 2020 for Hypertensive emergency and started on Norvasc. She does monitor her BP at home and reports readings of 120's/80's while being on this medication. She needs a refill. She denies chest pain/SOB.   Hyperlipidemia - Has been on medication in the past. Not currently on medication.    Health Maintenance: Dental -- Routine Care Vision -- Does not do routine care  Immunizations -- UTD Colonoscopy -- Never had - has two first degree relatives with h/o colon cancer Mammogram -- 2019  PAP -- No longer needs - hysterectomy    Past Medical History:  Diagnosis Date  . Ectopic pregnancy   . Fibroid   . Hyperlipidemia   . Hypertension   . Infection    UTI    Past Surgical History:  Procedure Laterality Date  . ABDOMINAL HYSTERECTOMY    . ECTOPIC PREGNANCY SURGERY    . ENDOMETRIAL ABLATION    . ROBOTIC ASSISTED TOTAL HYSTERECTOMY Right 06/15/2013   Procedure: ROBOTIC ASSISTED TOTAL HYSTERECTOMY AND RIGHT SALINGECTOMY;  Surgeon: Lahoma Crocker, MD;  Location: [facility];  Service: Gynecology;  Laterality: Right;  . vagianl deliveries  [place]    Current Outpatient Medications on File Prior to Visit  Medication Sig Dispense Refill  . amoxicillin-clavulanate (AUGMENTIN XR) 1000-62.5 MG per  tablet Take 2 tablets by mouth 2 (two) times daily. X 5 days 20 tablet 0  . cefdinir (OMNICEF) 300 MG capsule Take 1 capsule (300 mg total) by mouth 2 (two) times daily. 20 capsule 0  . guaiFENesin-codeine (ROBITUSSIN AC) 100-10 MG/5ML syrup Take 10 mLs by mouth 4 (four) times daily as needed for cough. 180 mL 0  . Hydrocodone-Acetaminophen (VICODIN) 5-300 MG TABS Take 2 tablets by mouth every 6 (six) hours. 30 each 0  . ibuprofen (ADVIL,MOTRIN) 800 MG tablet Take 1 tablet (800 mg total) by mouth every 8 (eight) hours as needed for pain. 30 tablet 5  . ipratropium (ATROVENT) 0.06 % nasal spray Place 2 sprays into both nostrils 4 (four) times daily. 15 mL 1  . LISINOPRIL-HYDROCHLOROTHIAZIDE PO Take by mouth.    . loratadine (CLARITIN) 10 MG tablet Take 1 tablet (10 mg total) by mouth daily. 30 tablet 1  . Prenatal Multivit-Min-Fe-FA (PRENATAL/IRON PO) Take 1 tablet by mouth daily.     . valsartan-hydrochlorothiazide (DIOVAN-HCT) 80-12.5 MG per tablet Take 1 tablet by mouth every morning.     No current facility-administered medications on file prior to visit.     Allergies  Allergen Reactions  . Cocoa Hives    Family History  Problem Relation Age of Onset  . Cancer Paternal Grandfather   . Cancer Maternal Grandmother        breast    Social History   Socioeconomic History  . Marital status: Single    Spouse name: Not on file  . Number of  children: Not on file  . Years of education: Not on file  . Highest education level: Not on file  Occupational History  . Not on file  Social Needs  . Financial resource strain: Not on file  . Food insecurity    Worry: Not on file    Inability: Not on file  . Transportation needs    Medical: Not on file    Non-medical: Not on file  Tobacco Use  . Smoking status: Former Smoker    Packs/day: 0.01    Years: 10.00    Pack years: 0.10  . Smokeless tobacco: Never Used  Substance and Sexual Activity  . Alcohol use: Yes    Comment:  occasional  . Drug use: No  . Sexual activity: Not on file  Lifestyle  . Physical activity    Days per week: Not on file    Minutes per session: Not on file  . Stress: Not on file  Relationships  . Social Herbalist on phone: Not on file    Gets together: Not on file    Attends religious service: Not on file    Active member of club or organization: Not on file    Attends meetings of clubs or organizations: Not on file    Relationship status: Not on file  . Intimate partner violence    Fear of current or ex partner: Not on file    Emotionally abused: Not on file    Physically abused: Not on file    Forced sexual activity: Not on file  Other Topics Concern  . Not on file  Social History Narrative  . Not on file    ROS  LMP 05/29/2013   Physical Exam VITALS per patient if applicable:  GENERAL: alert, oriented, appears well and in no acute distress  HEENT: atraumatic, conjunttiva clear, no obvious abnormalities on inspection of external nose and ears  NECK: normal movements of the head and neck  LUNGS: on inspection no signs of respiratory distress, breathing rate appears normal, no obvious gross SOB, gasping or wheezing  CV: no obvious cyanosis  MS: moves all visible extremities without noticeable abnormality  PSYCH/NEURO: pleasant and cooperative, no obvious depression or anxiety, speech and thought processing grossly intact  No results found for this or any previous visit (from the past 2160 hour(s)).  Assessment/Plan:  Discussed the following assessment and plan:  1. Encounter to establish care - Follow up for CPE or sooner if needed  2. Essential hypertension  - amLODipine (NORVASC) 5 MG tablet; Take 1 tablet (5 mg total) by mouth daily.  Dispense: 90 tablet; Refill: 3  3. Colon cancer screening  - Ambulatory referral to Gastroenterology   I discussed the assessment and treatment plan with the patient. The patient was provided an  opportunity to ask questions and all were answered. The patient agreed with the plan and demonstrated an understanding of the instructions.   The patient was advised to call back or seek an in-person evaluation if the symptoms worsen or if the condition fails to improve as anticipated.   Dorothyann Peng, NP

## 2019-05-08 MED FILL — MONTELUKAST SOD 10 MG TAB: 10 | 90 days supply | Qty: 90 | Fill #0

## 2019-05-08 MED FILL — AMLODIPINE BESYLATE 5 MG TA: 5 | 90 days supply | Qty: 90 | Fill #0

## 2019-05-08 MED FILL — LEVOCETIRIZINE 5 MG TABLET: 5 | 90 days supply | Qty: 90 | Fill #0

## 2019-05-19 ENCOUNTER — Ambulatory Visit (INDEPENDENT_AMBULATORY_CARE_PROVIDER_SITE_OTHER): Payer: 59 | Admitting: Adult Health

## 2019-05-19 ENCOUNTER — Other Ambulatory Visit: Payer: Self-pay

## 2019-05-19 ENCOUNTER — Encounter: Payer: Self-pay | Admitting: Adult Health

## 2019-05-19 VITALS — BP 160/110 | Temp 98.1°F | Ht 65.5 in | Wt 196.0 lb

## 2019-05-19 DIAGNOSIS — I10 Essential (primary) hypertension: Secondary | ICD-10-CM

## 2019-05-19 DIAGNOSIS — Z0000 Encounter for general adult medical examination without abnormal findings: Secondary | ICD-10-CM

## 2019-05-19 DIAGNOSIS — D171 Benign lipomatous neoplasm of skin and subcutaneous tissue of trunk: Secondary | ICD-10-CM

## 2019-05-19 DIAGNOSIS — Z5320 Procedure and treatment not carried out because of patient's decision for unspecified reasons: Secondary | ICD-10-CM

## 2019-05-19 DIAGNOSIS — E785 Hyperlipidemia, unspecified: Secondary | ICD-10-CM

## 2019-05-19 LAB — POCT URINALYSIS DIPSTICK
Bilirubin, UA: NEGATIVE
Blood, UA: NEGATIVE
Glucose, UA: NEGATIVE
Ketones, UA: NEGATIVE
Leukocytes, UA: NEGATIVE
Nitrite, UA: NEGATIVE
Odor: NEGATIVE
Protein, UA: NEGATIVE
Spec Grav, UA: 1.02 (ref 1.010–1.025)
Urobilinogen, UA: 0.2 E.U./dL
pH, UA: 6 (ref 5.0–8.0)

## 2019-05-19 LAB — CBC WITH DIFFERENTIAL/PLATELET
Basophils Absolute: 0 10*3/uL (ref 0.0–0.1)
Basophils Relative: 0.2 % (ref 0.0–3.0)
Eosinophils Absolute: 0.2 10*3/uL (ref 0.0–0.7)
Eosinophils Relative: 2 % (ref 0.0–5.0)
HCT: 42.5 % (ref 36.0–46.0)
Hemoglobin: 14 g/dL (ref 12.0–15.0)
Lymphocytes Relative: 43 % (ref 12.0–46.0)
Lymphs Abs: 3.9 10*3/uL (ref 0.7–4.0)
MCHC: 32.9 g/dL (ref 30.0–36.0)
MCV: 97.6 fl (ref 78.0–100.0)
Monocytes Absolute: 0.6 10*3/uL (ref 0.1–1.0)
Monocytes Relative: 6.6 % (ref 3.0–12.0)
Neutro Abs: 4.3 10*3/uL (ref 1.4–7.7)
Neutrophils Relative %: 48.2 % (ref 43.0–77.0)
Platelets: 371 10*3/uL (ref 150.0–400.0)
RBC: 4.36 Mil/uL (ref 3.87–5.11)
RDW: 14.7 % (ref 11.5–15.5)
WBC: 9 10*3/uL (ref 4.0–10.5)

## 2019-05-19 LAB — COMPREHENSIVE METABOLIC PANEL
ALT: 11 U/L (ref 0–35)
AST: 14 U/L (ref 0–37)
Albumin: 4.6 g/dL (ref 3.5–5.2)
Alkaline Phosphatase: 104 U/L (ref 39–117)
BUN: 6 mg/dL (ref 6–23)
CO2: 27 mEq/L (ref 19–32)
Calcium: 9.9 mg/dL (ref 8.4–10.5)
Chloride: 102 mEq/L (ref 96–112)
Creatinine, Ser: 0.74 mg/dL (ref 0.40–1.20)
GFR: 100.54 mL/min (ref >60.00)
Glucose, Bld: 90 mg/dL (ref 70–99)
Potassium: 3.9 mEq/L (ref 3.5–5.1)
Sodium: 141 mEq/L (ref 135–145)
Total Bilirubin: 0.4 mg/dL (ref 0.2–1.2)
Total Protein: 7 g/dL (ref 6.0–8.3)

## 2019-05-19 LAB — LIPID PANEL
Cholesterol: 265 mg/dL — ABNORMAL HIGH (ref 0–200)
HDL: 52.5 mg/dL (ref >39.00)
LDL Cholesterol: 186 mg/dL — ABNORMAL HIGH (ref 0–99)
NonHDL: 212.85
Total CHOL/HDL Ratio: 5
Triglycerides: 133 mg/dL (ref 0.0–149.0)
VLDL: 26.6 mg/dL (ref 0.0–40.0)

## 2019-05-19 LAB — HEMOGLOBIN A1C: Hgb A1c MFr Bld: 5.7 % (ref 4.6–6.5)

## 2019-05-19 LAB — TSH: TSH: 1.5 u[IU]/mL (ref 0.35–4.50)

## 2019-05-19 MED ORDER — VALSARTAN-HYDROCHLOROTHIAZIDE 80-12.5 MG PO TABS: 1.0000 | ORAL_TABLET | Freq: Every day | ORAL | 0 refills | Status: DC

## 2019-05-19 MED FILL — VALSARTAN-HCTZ 80-12.5 MG T: 80-12.5 | 30 days supply | Qty: 30 | Fill #0

## 2019-05-19 NOTE — Progress Notes (Signed)
Subjective:    Patient ID: Stacy Berry, female    DOB: 06-Jul-1971, 48 y.o.   MRN: [id number]  HPI Patient presents for yearly preventative medicine examination. She is a pleasant 48 year old female who  has a past medical history of Ectopic pregnancy, Fibroid, Hyperlipidemia, Hypertension, and Infection.  Essential Hypertension -was recently seen in the ER for hypertensive emergency in May 2020.  At this time she was not on any medication was started on Norvasc 5 mg.  He has been monitoring her blood pressure at home and reports readings of 150-160/90's.  Denies chest pain, shortness of breath, lower extremity swelling, dizziness, lightheadedness, or syncopal episodes. In the past she has been on Diovan.   Hyperlipidemia -has been on medication in the past.  She cannot remember which medication she was on.  She is not currently taking statin medication.  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 This has been present for many years. She denies pain but does feel like it is getting larger.   All immunizations and health maintenance protocols were reviewed with the patient and needed orders were placed.  Appropriate screening laboratory values were ordered for the patient including screening of hyperlipidemia, renal function and hepatic function.  Medication reconciliation,  past medical history, social history, problem list and allergies were reviewed in detail with the patient  Goals were established with regard to weight loss, exercise, and  diet in compliance with medications  She is up-to-date on routine dental care.  She has not had an eye exam recently.  Her last mammogram was in 2019.  She has a history of his hystectomy and no longer needs a Pap. She is going to follow up with GYN for mammogram    Review of Systems  Constitutional: Negative.   HENT: Negative.   Eyes: Negative.   Respiratory: Negative.   Cardiovascular: Negative.   Gastrointestinal: Negative.    Endocrine: Negative.   Genitourinary: Negative.   Musculoskeletal: Negative.   Skin: Negative.   Allergic/Immunologic: Negative.   Neurological: Positive for headaches (intermittent ).  Hematological: Negative.   Psychiatric/Behavioral: Negative.    Past Medical History:  Diagnosis Date  . Ectopic pregnancy   . Fibroid   . Hyperlipidemia   . Hypertension   . Infection    UTI    Social History   Socioeconomic History  . Marital status: Single    Spouse name: Not on file  . Number of children: Not on file  . Years of education: Not on file  . Highest education level: Not on file  Occupational History  . Not on file  Social Needs  . Financial resource strain: Not on file  . Food insecurity    Worry: Not on file    Inability: Not on file  . Transportation needs    Medical: Not on file    Non-medical: Not on file  Tobacco Use  . Smoking status: Former Smoker    Packs/day: 0.01    Years: 10.00    Pack years: 0.10  . Smokeless tobacco: Never Used  Substance and Sexual Activity  . Alcohol use: Yes    Comment: occasional  . Drug use: No  . Sexual activity: Not on file  Lifestyle  . Physical activity    Days per week: Not on file    Minutes per session: Not on file  . Stress: Not on file  Relationships  . Social Herbalist on  phone: Not on file    Gets together: Not on file    Attends religious service: Not on file    Active member of club or organization: Not on file    Attends meetings of clubs or organizations: Not on file    Relationship status: Not on file  . Intimate partner violence    Fear of current or ex partner: Not on file    Emotionally abused: Not on file    Physically abused: Not on file    Forced sexual activity: Not on file  Other Topics Concern  . Not on file  Social History Narrative   Wor    Past Surgical History:  Procedure Laterality Date  . ABDOMINAL HYSTERECTOMY    . ECTOPIC PREGNANCY SURGERY  1990  . ENDOMETRIAL  ABLATION    . ROBOTIC ASSISTED TOTAL HYSTERECTOMY Right 06/15/2013   Procedure: ROBOTIC ASSISTED TOTAL HYSTERECTOMY AND RIGHT SALINGECTOMY;  Surgeon: Lahoma Crocker, MD;  Location: [facility];  Service: Gynecology;  Laterality: Right;  . vagianl deliveries  [place]    Family History  Problem Relation Age of Onset  . Breast cancer Mother   . Colon cancer Mother   . Colon cancer Father   . Cancer Paternal Grandfather   . Breast cancer Maternal Grandmother   . Diabetes Maternal Grandfather     Allergies  Allergen Reactions  . Cocoa Hives    Current Outpatient Medications on File Prior to Visit  Medication Sig Dispense Refill  . amLODipine (NORVASC) 5 MG tablet Take 1 tablet (5 mg total) by mouth daily. 90 tablet 3   No current facility-administered medications on file prior to visit.     LMP 05/29/2013       Objective:   Physical Exam Vitals signs and nursing note reviewed.  Constitutional:      General: She is not in acute distress.    Appearance: She is obese. She is not diaphoretic.     Comments: Obese around abdomen   HENT:     Head: Normocephalic and atraumatic.     Right Ear: Tympanic membrane, ear canal and external ear normal. There is no impacted cerumen.     Left Ear: Tympanic membrane, ear canal and external ear normal.     Nose: Nose normal. No congestion or rhinorrhea.     Mouth/Throat:     Mouth: Mucous membranes are moist.     Pharynx: Oropharynx is clear. No oropharyngeal exudate.  Eyes:     General: No scleral icterus.       Right eye: No discharge.        Left eye: No discharge.     Extraocular Movements: Extraocular movements intact.     Conjunctiva/sclera: Conjunctivae normal.     Pupils: Pupils are equal, round, and reactive to light.  Neck:     Musculoskeletal: Normal range of motion and neck supple.     Thyroid: No thyromegaly.     Vascular: No JVD.     Trachea: No tracheal deviation.  Cardiovascular:     Rate and Rhythm:  Normal rate and regular rhythm.     Heart sounds: Normal heart sounds. No murmur. No friction rub. No gallop.   Pulmonary:     Effort: Pulmonary effort is normal. No respiratory distress.     Breath sounds: Normal breath sounds. No stridor. No wheezing, rhonchi or rales.  Chest:     Chest wall: No tenderness.  Abdominal:     General: Bowel  sounds are normal. There is no distension.     Palpations: Abdomen is soft. There is no mass.     Tenderness: There is no abdominal tenderness. There is no right CVA tenderness, left CVA tenderness, guarding or rebound.     Hernia: No hernia is present.  Musculoskeletal: Normal range of motion.        General: No swelling, tenderness, deformity or signs of injury.     Right lower leg: No edema.     Left lower leg: No edema.     Comments: Baseball sized lipoma noted on right flank   Lymphadenopathy:     Cervical: No cervical adenopathy.  Skin:    General: Skin is warm and dry.     Coloration: Skin is not jaundiced or pale.     Findings: No bruising, erythema, lesion or rash.  Neurological:     General: No focal deficit present.     Mental Status: She is alert and oriented to person, place, and time. Mental status is at baseline.     Cranial Nerves: No cranial nerve deficit.     Sensory: No sensory deficit.     Motor: No weakness or abnormal muscle tone.     Coordination: Coordination normal.     Gait: Gait normal.     Deep Tendon Reflexes: Reflexes normal.  Psychiatric:        Mood and Affect: Mood normal.        Behavior: Behavior normal.        Thought Content: Thought content normal.        Judgment: Judgment normal.        Assessment & Plan:  1. Routine general medical examination at a health care facility - Work on weight loss through diet and exercise  - Follow up in one year or sooner if needed - CBC with Differential/Platelet - Comprehensive metabolic panel - Hemoglobin A1c - Lipid panel - TSH - POC Urinalysis Dipstick  2.  Essential hypertension - Will switch from Norvasc to Diovan-HCT - Follow up in two weeks  - CBC with Differential/Platelet - Comprehensive metabolic panel - Hemoglobin A1c - Lipid panel - TSH - valsartan-hydrochlorothiazide (DIOVAN HCT) 80-12.5 MG tablet; Take 1 tablet by mouth daily.  Dispense: 30 tablet; Refill: 0  3. Hyperlipidemia, unspecified hyperlipidemia type - Consider statin  - CBC with Differential/Platelet - Comprehensive metabolic panel - Hemoglobin A1c - Lipid panel - TSH  4. HIV screening declined  - HIV Antibody (routine testing w rflx)  5. Lipoma of torso  - Ambulatory referral to [facility]

## 2019-05-20 LAB — HIV ANTIBODY (ROUTINE TESTING W REFLEX): HIV 1&2 Ab, 4th Generation: NONREACTIVE

## 2019-05-21 ENCOUNTER — Ambulatory Visit: Payer: Self-pay

## 2019-05-21 NOTE — Telephone Encounter (Signed)
Provided lab results per Dorothyann Peng, NP of 05/20/19 Patient voices understanding.

## 2019-05-28 ENCOUNTER — Other Ambulatory Visit: Payer: Self-pay | Admitting: Family Medicine

## 2019-05-28 MED ORDER — SIMVASTATIN 10 MG PO TABS: 10.0000 mg | ORAL_TABLET | Freq: Every day | ORAL | 3 refills | Status: DC

## 2019-05-28 MED FILL — SIMVASTATIN 10 MG TABLET: 10 | 90 days supply | Qty: 90 | Fill #0

## 2019-06-02 ENCOUNTER — Ambulatory Visit: Payer: 59 | Admitting: Adult Health

## 2019-07-06 ENCOUNTER — Encounter: Payer: Self-pay | Admitting: Adult Health

## 2019-07-15 ENCOUNTER — Encounter (HOSPITAL_COMMUNITY): Payer: Self-pay | Admitting: Emergency Medicine

## 2019-07-15 ENCOUNTER — Emergency Department (HOSPITAL_COMMUNITY)
Admission: EM | Admit: 2019-07-15 | Discharge: 2019-07-16 | Disposition: A | Discharge status: Home / Self Care | Payer: 59 | Attending: Emergency Medicine | Admitting: Emergency Medicine

## 2019-07-15 ENCOUNTER — Other Ambulatory Visit: Payer: Self-pay

## 2019-07-15 DIAGNOSIS — Y9389 Activity, other specified: Secondary | ICD-10-CM | POA: Insufficient documentation

## 2019-07-15 DIAGNOSIS — Y999 Unspecified external cause status: Secondary | ICD-10-CM | POA: Diagnosis not present

## 2019-07-15 DIAGNOSIS — Z79899 Other long term (current) drug therapy: Secondary | ICD-10-CM | POA: Insufficient documentation

## 2019-07-15 DIAGNOSIS — S161XXA Strain of muscle, fascia and tendon at neck level, initial encounter: Secondary | ICD-10-CM | POA: Diagnosis not present

## 2019-07-15 DIAGNOSIS — I10 Essential (primary) hypertension: Secondary | ICD-10-CM | POA: Insufficient documentation

## 2019-07-15 DIAGNOSIS — M25512 Pain in left shoulder: Secondary | ICD-10-CM | POA: Diagnosis not present

## 2019-07-15 DIAGNOSIS — Y92410 Unspecified street and highway as the place of occurrence of the external cause: Secondary | ICD-10-CM | POA: Diagnosis not present

## 2019-07-15 DIAGNOSIS — S4992XA Unspecified injury of left shoulder and upper arm, initial encounter: Secondary | ICD-10-CM | POA: Diagnosis not present

## 2019-07-15 DIAGNOSIS — S199XXA Unspecified injury of neck, initial encounter: Secondary | ICD-10-CM | POA: Diagnosis present

## 2019-07-15 DIAGNOSIS — M25511 Pain in right shoulder: Secondary | ICD-10-CM | POA: Diagnosis not present

## 2019-07-15 DIAGNOSIS — S299XXA Unspecified injury of thorax, initial encounter: Secondary | ICD-10-CM | POA: Diagnosis not present

## 2019-07-15 DIAGNOSIS — R079 Chest pain, unspecified: Secondary | ICD-10-CM | POA: Diagnosis not present

## 2019-07-15 MED ORDER — METHOCARBAMOL 500 MG PO TABS
500.0000 mg | ORAL_TABLET | Freq: Once | ORAL | Status: AC
  Administered 2019-07-15: 500 mg via ORAL
  Filled 2019-07-15: qty 1

## 2019-07-15 MED ORDER — LIDOCAINE 5 % EX PTCH
1.0000 | MEDICATED_PATCH | CUTANEOUS | Status: DC
  Administered 2019-07-15: 1 via TRANSDERMAL
  Filled 2019-07-15: qty 1
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
## 2019-07-16 ENCOUNTER — Encounter (HOSPITAL_BASED_OUTPATIENT_CLINIC_OR_DEPARTMENT_OTHER): Payer: Self-pay | Admitting: *Deleted

## 2019-07-16 ENCOUNTER — Emergency Department (HOSPITAL_COMMUNITY)
Admission: EM | Admit: 2019-07-16 | Discharge: 2019-07-16 | Discharge status: Against Medical Advice | Payer: 59 | Source: Home / Self Care

## 2019-07-16 ENCOUNTER — Emergency Department (HOSPITAL_COMMUNITY): Payer: 59

## 2019-07-16 ENCOUNTER — Other Ambulatory Visit: Payer: Self-pay

## 2019-07-16 ENCOUNTER — Emergency Department (HOSPITAL_BASED_OUTPATIENT_CLINIC_OR_DEPARTMENT_OTHER)
Admission: EM | Admit: 2019-07-16 | Discharge: 2019-07-17 | Disposition: A | Discharge status: Home / Self Care | Payer: 59 | Source: Home / Self Care | Attending: Emergency Medicine | Admitting: Emergency Medicine

## 2019-07-16 ENCOUNTER — Encounter (HOSPITAL_COMMUNITY): Payer: Self-pay | Admitting: Emergency Medicine

## 2019-07-16 DIAGNOSIS — Z91018 Allergy to other foods: Secondary | ICD-10-CM | POA: Insufficient documentation

## 2019-07-16 DIAGNOSIS — S199XXA Unspecified injury of neck, initial encounter: Secondary | ICD-10-CM | POA: Diagnosis not present

## 2019-07-16 DIAGNOSIS — S299XXA Unspecified injury of thorax, initial encounter: Secondary | ICD-10-CM | POA: Diagnosis not present

## 2019-07-16 DIAGNOSIS — I10 Essential (primary) hypertension: Secondary | ICD-10-CM | POA: Insufficient documentation

## 2019-07-16 DIAGNOSIS — S4992XA Unspecified injury of left shoulder and upper arm, initial encounter: Secondary | ICD-10-CM | POA: Diagnosis not present

## 2019-07-16 DIAGNOSIS — S161XXA Strain of muscle, fascia and tendon at neck level, initial encounter: Secondary | ICD-10-CM | POA: Diagnosis not present

## 2019-07-16 DIAGNOSIS — M549 Dorsalgia, unspecified: Secondary | ICD-10-CM | POA: Insufficient documentation

## 2019-07-16 DIAGNOSIS — Z87891 Personal history of nicotine dependence: Secondary | ICD-10-CM | POA: Insufficient documentation

## 2019-07-16 DIAGNOSIS — E785 Hyperlipidemia, unspecified: Secondary | ICD-10-CM | POA: Insufficient documentation

## 2019-07-16 DIAGNOSIS — R079 Chest pain, unspecified: Secondary | ICD-10-CM | POA: Diagnosis not present

## 2019-07-16 DIAGNOSIS — Z79899 Other long term (current) drug therapy: Secondary | ICD-10-CM | POA: Diagnosis not present

## 2019-07-16 DIAGNOSIS — M25512 Pain in left shoulder: Secondary | ICD-10-CM | POA: Insufficient documentation

## 2019-07-16 MED ORDER — ACETAMINOPHEN 500 MG PO TABS
1000.0000 mg | ORAL_TABLET | Freq: Once | ORAL | Status: AC
  Administered 2019-07-16: 1000 mg via ORAL
  Filled 2019-07-16: qty 2

## 2019-07-16 MED ORDER — KETOROLAC TROMETHAMINE 60 MG/2ML IM SOLN
30.0000 mg | Freq: Once | INTRAMUSCULAR | Status: AC
  Administered 2019-07-16: 30 mg via INTRAMUSCULAR
  Filled 2019-07-16: qty 2

## 2019-07-16 MED ORDER — LIDOCAINE 5 % EX PTCH: 1.0000 | MEDICATED_PATCH | CUTANEOUS | 0 refills | Status: DC

## 2019-07-16 MED ORDER — METHOCARBAMOL 500 MG PO TABS: 500.0000 mg | ORAL_TABLET | Freq: Two times a day (BID) | ORAL | 0 refills | Status: DC | PRN
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
## 2019-07-16 NOTE — ED Notes (Signed)
Per registration, patient reports she is leaving. ?

## 2019-07-16 NOTE — Discharge Instructions (Signed)
Take ibuprofen 3 times a day with meals.  Do not take other anti-inflammatories at the same time (Advil, Motrin, naproxen, Aleve). You may supplement with Tylenol if you need further pain control. Use robaxin as needed for muscle stiffness or soreness.  Have caution, this may make you tired or groggy.  Do not drive or operate heavy machinery while taking this medicine. Use ice packs or heating pads if this helps control your pain. Use Lidoderm patches to help with pain. You will likely have continued muscle stiffness and soreness over the next couple days.  Follow-up with primary care in 1 week if your symptoms are not improving. Return to the emergency room if you develop vision changes, vomiting, slurred speech, numbness, loss of bowel or bladder control, or any new or worsening symptoms.
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
## 2019-07-16 NOTE — ED Notes (Signed)
Patient verbalized understanding of dc instructions, vss, ambulatory with nad.   

## 2019-07-16 NOTE — ED Notes (Signed)
Patient transported to CT and xray 
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
## 2019-07-16 NOTE — ED Notes (Signed)
ED Provider at bedside. 

## 2019-07-17 ENCOUNTER — Encounter (HOSPITAL_BASED_OUTPATIENT_CLINIC_OR_DEPARTMENT_OTHER): Payer: Self-pay | Admitting: Emergency Medicine

## 2019-07-17 MED ORDER — DICLOFENAC SODIUM ER 100 MG PO TB24: 100.0000 mg | ORAL_TABLET | Freq: Every day | ORAL | 0 refills | Status: DC

## 2019-07-17 MED FILL — METHOCARBAMOL 500 MG TABS: 500 | 7 days supply | Qty: 14 | Fill #0

## 2019-07-17 MED FILL — DICLOFENAC SOD ER 100 MG TA: 100 | 10 days supply | Qty: 10 | Fill #0

## 2019-07-17 MED FILL — LIDOCAINE PATCH 5%: 5 | 30 days supply | Qty: 30 | Fill #0
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
## 2019-07-17 NOTE — Discharge Instructions (Addendum)
Use lidoderm and thermacare heating wraps.  Voltaren is a high dose NSAID.  May add tylenol to your regimen.  Also soaking in a hot tub can help

## 2019-07-21 ENCOUNTER — Encounter: Payer: Self-pay | Admitting: Adult Health

## 2019-07-21 ENCOUNTER — Other Ambulatory Visit: Payer: Self-pay

## 2019-07-21 ENCOUNTER — Telehealth (INDEPENDENT_AMBULATORY_CARE_PROVIDER_SITE_OTHER): Payer: 59 | Admitting: Adult Health

## 2019-07-21 ENCOUNTER — Other Ambulatory Visit: Payer: Self-pay | Admitting: Family Medicine

## 2019-07-21 DIAGNOSIS — M546 Pain in thoracic spine: Secondary | ICD-10-CM

## 2019-07-21 DIAGNOSIS — S29012A Strain of muscle and tendon of back wall of thorax, initial encounter: Secondary | ICD-10-CM | POA: Diagnosis not present

## 2019-07-21 MED ORDER — METHYLPREDNISOLONE 4 MG PO TBPK: ORAL_TABLET | ORAL | 0 refills | Status: DC

## 2019-07-21 MED FILL — METHYLPREDNISOLONE 4 MG TAB: 4 | 6 days supply | Qty: 21 | Fill #0
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
## 2019-08-11 MED FILL — AZELASTINE HCL 137 MCG SPRY: 0.1 | 30 days supply | Qty: 30 | Fill #0

## 2019-08-11 MED FILL — FLUTICASONE PROP 50 MCG SPR: 50 | 30 days supply | Qty: 16 | Fill #0

## 2019-08-18 MED FILL — TRIAMCINOLONE 0.1% PASTE: 0.1 | 7 days supply | Qty: 5 | Fill #0

## 2019-08-26 ENCOUNTER — Ambulatory Visit: Payer: 59 | Admitting: Adult Health

## 2019-08-27 ENCOUNTER — Ambulatory Visit: Payer: 59 | Admitting: Adult Health

## 2019-09-11 ENCOUNTER — Other Ambulatory Visit: Payer: Self-pay | Admitting: Adult Health

## 2019-09-11 DIAGNOSIS — I10 Essential (primary) hypertension: Secondary | ICD-10-CM

## 2019-09-11 MED FILL — AMLODIPINE BESYLATE 5 MG TA: 5 | 90 days supply | Qty: 90 | Fill #1

## 2019-09-11 MED FILL — MONTELUKAST SOD 10 MG TAB: 10 | 30 days supply | Qty: 30 | Fill #1

## 2019-09-11 MED FILL — LEVOCETIRIZINE 5 MG TABLET: 5 | 30 days supply | Qty: 30 | Fill #1

## 2019-09-11 MED FILL — AZELASTINE HCL 0.05% DROPS: 0.05 | 30 days supply | Qty: 6 | Fill #0

## 2019-09-11 MED FILL — AZELASTINE HCL 137 MCG SPRY: 0.1 | 30 days supply | Qty: 30 | Fill #1

## 2019-09-11 MED FILL — FLUTICASONE PROP 50 MCG SPR: 50 | 30 days supply | Qty: 16 | Fill #1

## 2019-09-11 MED FILL — SIMVASTATIN 10 MG TABLET: 10 | 90 days supply | Qty: 90 | Fill #1

## 2019-09-11 MED FILL — TRIAMCINOLONE 0.1% PASTE: 0.1 | 7 days supply | Qty: 5 | Fill #1

## 2019-09-15 MED FILL — VALSARTAN-HCTZ 80-12.5 MG T: 80-12.5 | 90 days supply | Qty: 90 | Fill #0

## 2019-09-15 NOTE — Telephone Encounter (Signed)
Sent to the pharmacy by e-scribe. 

## 2019-10-22 ENCOUNTER — Encounter: Payer: Self-pay | Admitting: Obstetrics & Gynecology

## 2019-10-28 MED FILL — LEVOCETIRIZINE 5 MG TABLET: 5 | 30 days supply | Qty: 30 | Fill #0

## 2019-10-28 MED FILL — MONTELUKAST SOD 10 MG TAB: 10 | 30 days supply | Qty: 30 | Fill #0

## 2019-11-24 ENCOUNTER — Encounter: Payer: 59 | Admitting: Obstetrics & Gynecology

## 2019-11-24 ENCOUNTER — Telehealth: Payer: Self-pay | Admitting: Obstetrics & Gynecology

## 2019-11-24 NOTE — Telephone Encounter (Signed)
There is nothing in this note so just wanted to forward back to you to see if you needed to give me any information.  Thanks.

## 2019-12-01 ENCOUNTER — Ambulatory Visit (INDEPENDENT_AMBULATORY_CARE_PROVIDER_SITE_OTHER): Payer: 59 | Admitting: Obstetrics & Gynecology

## 2019-12-01 ENCOUNTER — Other Ambulatory Visit: Payer: Self-pay

## 2019-12-01 ENCOUNTER — Other Ambulatory Visit (HOSPITAL_COMMUNITY)
Admission: RE | Admit: 2019-12-01 | Discharge: 2019-12-01 | Disposition: A | Discharge status: Home / Self Care | Payer: 59 | Source: Ambulatory Visit | Attending: Obstetrics & Gynecology | Admitting: Obstetrics & Gynecology

## 2019-12-01 ENCOUNTER — Encounter: Payer: Self-pay | Admitting: Obstetrics & Gynecology

## 2019-12-01 VITALS — BP 118/70 | HR 76 | Temp 97.5°F | Resp 12 | Ht 65.0 in | Wt 197.0 lb

## 2019-12-01 DIAGNOSIS — N898 Other specified noninflammatory disorders of vagina: Secondary | ICD-10-CM | POA: Diagnosis not present

## 2019-12-01 DIAGNOSIS — R8290 Unspecified abnormal findings in urine: Secondary | ICD-10-CM

## 2019-12-01 DIAGNOSIS — Z01419 Encounter for gynecological examination (general) (routine) without abnormal findings: Secondary | ICD-10-CM | POA: Diagnosis not present

## 2019-12-01 DIAGNOSIS — Z113 Encounter for screening for infections with a predominantly sexual mode of transmission: Secondary | ICD-10-CM

## 2019-12-01 DIAGNOSIS — Z124 Encounter for screening for malignant neoplasm of cervix: Secondary | ICD-10-CM | POA: Insufficient documentation

## 2019-12-01 LAB — POCT URINALYSIS DIPSTICK
Bilirubin, UA: NEGATIVE
Blood, UA: NEGATIVE
Glucose, UA: NEGATIVE
Ketones, UA: NEGATIVE
Nitrite, UA: NEGATIVE
Protein, UA: POSITIVE — AB
Urobilinogen, UA: 0.2 E.U./dL
pH, UA: 5 (ref 5.0–8.0)

## 2019-12-01 LAB — RESULTS CONSOLE HPV: CHL HPV: NEGATIVE

## 2019-12-01 LAB — HM PAP SMEAR: HM Pap smear: NORMAL

## 2019-12-01 NOTE — Progress Notes (Signed)
49 y.o. [id number] Single Black or African American female here as a new patient for an annual exam.  Pt and I met the day we both received out second [place] vaccinePatient complains of having vaginal irritation that "comes and goes".    Denies vaginal bleeding.  Having a little bit of vaginal itching.  Denies vaginal discharge or vaginal odor.  Has noted some increased urine odor.    H/o TLH in 2014 due to   Patient's last menstrual period was 05/29/2013.          Sexually active: No.  The current method of family planning is status post hysterectomy.    Exercising: Yes.    walking Smoker:  Former  Health Maintenance: Pap:  05/11/13 ASCUS:Pos HR HPV History of abnormal Pap:  Yes, hx of Cryosurgery MMG:  01/10/18 BIRADS 1 negative/density c Colonoscopy:  Has been referred by Dr. Tylene Fantasia BMD:   n/a TDaP:  Unsure but works with [employer] so must be UTD Pneumonia vaccine(s):  n/a Shingrix:  Declines right now but we discussed today Hep C testing: n/a Screening Labs: PCP   reports that she has quit smoking. She has a 0.10 pack-year smoking history. She has never used smokeless tobacco. She reports current alcohol use. She reports that she does not use drugs.  Past Medical History:  Diagnosis Date  . Ectopic pregnancy   . Fibroid   . Hyperlipidemia   . Hypertension   . Infection    UTI  . Lipoma     Past Surgical History:  Procedure Laterality Date  . ABDOMINAL HYSTERECTOMY    . ECTOPIC PREGNANCY SURGERY  1990  . ENDOMETRIAL ABLATION    . GYNECOLOGIC CRYOSURGERY    . ROBOTIC ASSISTED TOTAL HYSTERECTOMY Right 06/15/2013   Procedure: ROBOTIC ASSISTED TOTAL HYSTERECTOMY AND RIGHT SALINGECTOMY;  Surgeon: Lahoma Crocker, MD;  Location: [facility];  Service: Gynecology;  Laterality: Right;  . vagianl deliveries  [place]    Current Outpatient Medications  Medication Sig Dispense Refill  . amLODipine (NORVASC) 5 MG tablet Take 5 mg by mouth daily.    Marland Kitchen azelastine  (OPTIVAR) 0.05 % ophthalmic solution     . levocetirizine (XYZAL) 5 MG tablet     . montelukast (SINGULAIR) 10 MG tablet     . simvastatin (ZOCOR) 10 MG tablet Take 1 tablet (10 mg total) by mouth at bedtime. (Patient not taking: Reported on 12/01/2019) 90 tablet 3   No current facility-administered medications for this visit.    Family History  Problem Relation Age of Onset  . Breast cancer Mother   . Prostate cancer Father   . Cancer Paternal Grandfather   . Breast cancer Maternal Grandmother   . Diabetes Maternal Grandfather   . Breast cancer Sister     Review of Systems  Genitourinary:       Odor in urine  All other systems reviewed and are negative.   Exam:   BP 118/70 (BP Location: Right Arm, Patient Position: Sitting, Cuff Size: Large)   Pulse 76   Temp (!) 97.5 F (36.4 C) (Temporal)   Resp 12   Ht 5' 5"  (1.651 m)   Wt 197 lb (89.4 kg)   LMP 05/29/2013   BMI 32.78 kg/m      Height: 5' 5"  (165.1 cm)  Ht Readings from Last 3 Encounters:  12/01/19 5' 5"  (1.651 m)  07/16/19 5' 4.5" (1.638 m)  05/19/19 5' 5.5" (1.664 m)   General appearance: alert, cooperative  and appears stated age Head: Normocephalic, without obvious abnormality, atraumatic Neck: no adenopathy, supple, symmetrical, trachea midline and thyroid nodule on right noted, swelling above clavicles noted bilaterally Lungs: clear to auscultation bilaterally Breasts: normal appearance, no masses or tenderness Heart: regular rate and rhythm Abdomen: soft, non-tender; bowel sounds normal; no masses,  no organomegaly Extremities: extremities normal, atraumatic, no cyanosis or edema Skin: Skin color, texture, turgor normal. No rashes or lesions Lymph nodes: Cervical, supraclavicular, and axillary nodes normal. No abnormal inguinal nodes palpated Neurologic: Grossly normal  Pelvic: External genitalia:  no lesions              Urethra:  normal appearing urethra with no masses, tenderness or lesions               Bartholins and Skenes: normal                 Vagina: normal appearing vagina with normal color and discharge, no lesions              Cervix: absent              Pap taken: Yes.   Bimanual Exam:  Uterus:  uterus absent              Adnexa: no mass, fullness, tenderness               Rectovaginal: Confirms               Anus:  normal sphincter tone, no lesions  Chaperone, Terence Lux, CMA, was present for exam.  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  She knows this is due. pap smear with HR HPV obtained today Vaginitis swab obtained today Will schedule thyroid ultrasound for pt Colonoscopy referral has been done by [employer].  Pt received call but didn't call back.  Information provided today. Urine culture obtained today Return annually or prn

## 2019-12-01 NOTE — Patient Instructions (Signed)
[facility] [address], [place], [place] [address] Phone: [phone]  Dr. Silvano Rusk

## 2019-12-02 ENCOUNTER — Telehealth: Payer: Self-pay

## 2019-12-02 DIAGNOSIS — E041 Nontoxic single thyroid nodule: Secondary | ICD-10-CM

## 2019-12-02 LAB — CYTOLOGY - PAP
Comment: NEGATIVE
Diagnosis: NEGATIVE
High risk HPV: NEGATIVE

## 2019-12-02 MED FILL — MONTELUKAST SOD 10 MG TAB: 10 | 30 days supply | Qty: 30 | Fill #0

## 2019-12-02 MED FILL — LEVOCETIRIZINE 5 MG TABLET: 5 | 30 days supply | Qty: 30 | Fill #0

## 2019-12-02 NOTE — Telephone Encounter (Signed)
Patient placed in IMG hold.  

## 2019-12-02 NOTE — Telephone Encounter (Signed)
-----   Message from Megan Salon, MD sent at 12/01/2019  4:44 PM EST ----- Regarding: thyroid ultrasound Pt has right thyroid nodule and swelling above clavicles.  Needs thyroid ultrasound.  Can you schedule for 8am or earliest appt possible?  Thanks.  Vinnie Level

## 2019-12-02 NOTE — Telephone Encounter (Signed)
Spoke to Melody at [facility] and appt made for 3/31 at 8am for thyroid US.   Placed call to pt. Pt given information on thyroid US and appt. Pt agreeable.   Orders placed for thyroid US.   Routing to Dr Sabra Heck for review and will close encounter.  CC: Glorianne Manchester, RN to place pt in IMG hold.

## 2019-12-03 ENCOUNTER — Telehealth: Payer: Self-pay | Admitting: Obstetrics & Gynecology

## 2019-12-03 LAB — NUSWAB VAGINITIS PLUS (VG+)
BVAB 2: HIGH Score — AB
Candida albicans, NAA: NEGATIVE
Candida glabrata, NAA: NEGATIVE
Chlamydia trachomatis, NAA: NEGATIVE
Megasphaera 1: HIGH Score — AB
Neisseria gonorrhoeae, NAA: NEGATIVE
Trich vag by NAA: POSITIVE — AB

## 2019-12-03 LAB — URINE CULTURE

## 2019-12-03 MED ORDER — METRONIDAZOLE 500 MG PO TABS: 500.0000 mg | ORAL_TABLET | Freq: Two times a day (BID) | ORAL | 0 refills | Status: DC

## 2019-12-03 MED FILL — metroNIDAZOLE 500 MG TABS: 500 | 7 days supply | Qty: 14 | Fill #0

## 2019-12-03 NOTE — Telephone Encounter (Signed)
Patient calling to go over test results.

## 2019-12-03 NOTE — Telephone Encounter (Signed)
Megan Salon, MD  12/03/2019 6:20 AM EST    Please let pt know her vaginitis testing isn't complete but is showing BV. Yeast is negative, just FYI. Ok to treat with Metrogel [id number], one applicator QHS x 5 nights OR Tindamax 1gram daily x 4 days. If pt chooses oral medication, please advise no ETOH while on medication. No additional follow up is needed if symptoms resolve. Also, her urine culture is negative. The urine odor is likely coming from the [facility]. Lastly, her pap was neg and HR HPV was negative. She's had a hysterectomy but had HR HPV on pap prior to hysterectomy so I wanted to make sure this had cleared. It has. I will let her know about the rest of the vaginitis testing when it is finalized.      Dr. Sabra Heck -can you review final results and advise on any additional treatment.

## 2019-12-03 NOTE — Telephone Encounter (Signed)
Please let her know that the vaginal testing also showed trichomonas.  Because there is this and BV, should treat with flagyl 500mg  bid x 7 days.  Should have repeat testing to ensure resolution 2-4 weeks after treatment.  Needs follow-up.  Can do expedited partner treatment as well.

## 2019-12-03 NOTE — Telephone Encounter (Signed)
Spoke with patient, advised of all results as seen below per Dr. Sabra Heck. Rx for flagyl to verified pharmacy. Reviewed EPT, patient declines at this time, is aware to return call to office if Rx desired. Patient declined to schedule OV at this time, will review her work schedule and return call. Patient verbalizes understanding and is agreeable.   Routing to provider for final review. Patient is agreeable to disposition. Will close encounter.

## 2019-12-03 NOTE — Telephone Encounter (Signed)
Left message to call Yehonatan Grandison, RN at [facility] [phone].   

## 2019-12-25 ENCOUNTER — Encounter (HOSPITAL_BASED_OUTPATIENT_CLINIC_OR_DEPARTMENT_OTHER): Payer: Self-pay

## 2019-12-25 ENCOUNTER — Emergency Department (HOSPITAL_BASED_OUTPATIENT_CLINIC_OR_DEPARTMENT_OTHER)
Admission: EM | Admit: 2019-12-25 | Discharge: 2019-12-25 | Disposition: A | Discharge status: Home / Self Care | Payer: 59 | Attending: Emergency Medicine | Admitting: Emergency Medicine

## 2019-12-25 ENCOUNTER — Emergency Department (HOSPITAL_BASED_OUTPATIENT_CLINIC_OR_DEPARTMENT_OTHER): Payer: 59

## 2019-12-25 ENCOUNTER — Other Ambulatory Visit: Payer: Self-pay

## 2019-12-25 DIAGNOSIS — Y9389 Activity, other specified: Secondary | ICD-10-CM | POA: Insufficient documentation

## 2019-12-25 DIAGNOSIS — Y9289 Other specified places as the place of occurrence of the external cause: Secondary | ICD-10-CM | POA: Diagnosis not present

## 2019-12-25 DIAGNOSIS — W108XXA Fall (on) (from) other stairs and steps, initial encounter: Secondary | ICD-10-CM | POA: Diagnosis not present

## 2019-12-25 DIAGNOSIS — S8992XA Unspecified injury of left lower leg, initial encounter: Secondary | ICD-10-CM | POA: Diagnosis not present

## 2019-12-25 DIAGNOSIS — Z87891 Personal history of nicotine dependence: Secondary | ICD-10-CM | POA: Diagnosis not present

## 2019-12-25 DIAGNOSIS — Z79899 Other long term (current) drug therapy: Secondary | ICD-10-CM | POA: Insufficient documentation

## 2019-12-25 DIAGNOSIS — S86912A Strain of unspecified muscle(s) and tendon(s) at lower leg level, left leg, initial encounter: Secondary | ICD-10-CM | POA: Diagnosis not present

## 2019-12-25 DIAGNOSIS — S8392XA Sprain of unspecified site of left knee, initial encounter: Secondary | ICD-10-CM | POA: Insufficient documentation

## 2019-12-25 DIAGNOSIS — Y999 Unspecified external cause status: Secondary | ICD-10-CM | POA: Insufficient documentation

## 2019-12-25 DIAGNOSIS — I10 Essential (primary) hypertension: Secondary | ICD-10-CM | POA: Diagnosis not present

## 2019-12-25 MED ORDER — HYDROCODONE-ACETAMINOPHEN 5-325 MG PO TABS
2.0000 | ORAL_TABLET | Freq: Once | ORAL | Status: AC
  Administered 2019-12-25: 2 via ORAL
  Filled 2019-12-25: qty 2

## 2019-12-25 MED ORDER — DICLOFENAC SODIUM 75 MG PO TBEC: 75.0000 mg | DELAYED_RELEASE_TABLET | Freq: Two times a day (BID) | ORAL | 0 refills | Status: DC

## 2019-12-25 MED FILL — DICLOFENAC SODIUM 75 MG TAB: 75 | 10 days supply | Qty: 20 | Fill #0

## 2019-12-25 NOTE — ED Triage Notes (Signed)
Pt stated slid down wet steps yesterday hyperextending left knee.  Pain with weight bearing ever since.  Took motrin pm last night with little relief.  Applied heating pad overnight.

## 2019-12-25 NOTE — Discharge Instructions (Signed)
Return if any problems. Schedule to see the Orthopaedist for evaluation next week

## 2019-12-25 NOTE — ED Provider Notes (Signed)
[facility] EMERGENCY DEPARTMENT Provider Note   CSN: [id number] Arrival date & time: 12/25/19  1033     History Chief Complaint  Patient presents with  . Knee Pain    Stacy Berry is a 49 y.o. female.  The history is provided by the patient. No language interpreter was used.  Knee Pain Location:  Knee Time since incident:  1 day Injury: yes   Mechanism of injury: fall   Fall:    Fall occurred:  Down stairs Knee location:  L knee Pain details:    Quality:  Aching   Radiates to:  Does not radiate   Severity:  Moderate   Onset quality:  Gradual   Timing:  Constant Chronicity:  New Dislocation: no   Foreign body present:  No foreign bodies Worsened by:  Nothing Ineffective treatments:  None tried Risk factors: no frequent fractures   Pt slipped and fell.  Pt reports she twisted left knee.  (Pt states she fell like doing a split) Pt complains of pain with walking      Past Medical History:  Diagnosis Date  . Ectopic pregnancy   . Fibroid   . Hyperlipidemia   . Hypertension   . Infection    UTI  . Lipoma     Patient Active Problem List   Diagnosis Date Noted  . Essential hypertension 03/04/2018  . 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 Leiomyoma of uterus, unspecified 05/11/2013  . Lipoma of abdominal wall 09/03/2012    Past Surgical History:  Procedure Laterality Date  . ECTOPIC PREGNANCY SURGERY  1990  . ENDOMETRIAL ABLATION    . GYNECOLOGIC CRYOSURGERY    . NSVD  1992. 1995. 1998. 2000  . ROBOTIC ASSISTED TOTAL HYSTERECTOMY Right 06/15/2013   Procedure: ROBOTIC ASSISTED TOTAL HYSTERECTOMY AND RIGHT SALINGECTOMY;  Surgeon: Lahoma Crocker, MD;  Location: [facility];  Service: Gynecology;  Laterality: Right;     OB History    Gravida  5   Para  4   Term  4   Preterm      AB  1   Living  4     SAB      TAB      Ectopic  1   Multiple      Live Births  4           Family History  Problem Relation Age of  Onset  . Breast cancer Mother   . Prostate cancer Father   . Cancer Paternal Grandfather   . Breast cancer Maternal Grandmother   . Diabetes Maternal Grandfather   . Breast cancer Sister     Social History   Tobacco Use  . Smoking status: Former Smoker    Packs/day: 0.01    Years: 10.00    Pack years: 0.10  . Smokeless tobacco: Never Used  Substance Use Topics  . Alcohol use: Yes    Comment: occasional  . Drug use: No    Home Medications Prior to Admission medications   Medication Sig Start Date End Date Taking? Authorizing Provider  amLODipine (NORVASC) 5 MG tablet Take 5 mg by mouth daily. 09/11/19  Yes [provider]  levocetirizine (XYZAL) 5 MG tablet  05/08/19  Yes [provider]  montelukast (SINGULAIR) 10 MG tablet  05/08/19  Yes [provider]  azelastine (OPTIVAR) 0.05 % ophthalmic solution  09/11/19   [provider]  metroNIDAZOLE (FLAGYL) 500 MG tablet Take 1 tablet (500 mg  total) by mouth 2 (two) times daily. 12/03/19   Megan Salon, MD  simvastatin (ZOCOR) 10 MG tablet Take 1 tablet (10 mg total) by mouth at bedtime. Patient not taking: Reported on 12/01/2019 05/28/19   Dorothyann Peng, NP    Allergies    Cocoa  Review of Systems   Review of Systems  All other systems reviewed and are negative.   Physical Exam Updated Vital Signs BP 134/88 (BP Location: Right Arm)   Pulse 96   Temp 98.5 F (36.9 C) (Oral)   Resp 16   Ht 5\' 5"  (1.651 m)   Wt 89.4 kg   LMP 05/29/2013   SpO2 98%   BMI 32.78 kg/m   Physical Exam Vitals reviewed.  Cardiovascular:     Rate and Rhythm: Normal rate.  Pulmonary:     Effort: Pulmonary effort is normal.  Musculoskeletal:        General: Swelling and tenderness present.     Comments: Pain medial collateral area,  Pain with range of motion, nv and ns intact   Skin:    General: Skin is warm.  Neurological:     General: No focal deficit present.  Psychiatric:        Mood and  Affect: Mood normal.     ED Results / Procedures / Treatments   Labs (all labs ordered are listed, but only abnormal results are displayed) Labs Reviewed - No data to display  EKG None  Radiology DG Knee Complete 4 Views Left  Result Date: 12/25/2019 CLINICAL DATA:  Status post fall, EXAM: LEFT KNEE - COMPLETE 4+ VIEW COMPARISON:  None. FINDINGS: No evidence of fracture, dislocation, or joint effusion. Minimal medial femorotibial compartment joint space narrowing. Soft tissues are unremarkable. IMPRESSION: No acute osseous injury of the left knee. Electronically Signed   By: Kathreen Devoid   On: 12/25/2019 11:36    Procedures Procedures (including critical care time)  Medications Ordered in ED Medications  HYDROcodone-acetaminophen (NORCO/VICODIN) 5-325 MG per tablet 2 tablet (2 tablets Oral Given 12/25/19 1115)    ED Course  I have reviewed the triage vital signs and the nursing notes.  Pertinent labs & imaging results that were available during my care of the patient were reviewed by me and considered in my medical decision making (see chart for details).    MDM Rules/Calculators/A&P                      MDM:  Knee immobilizer, crutches.  Pt advised to follow up with Dr. Veverly Fells for recheck  Final Clinical Impression(s) / ED Diagnoses Final diagnoses:  Strain of left knee, initial encounter    Rx / DC Orders ED Discharge Orders         Ordered    diclofenac (VOLTAREN) 75 MG EC tablet  2 times daily     12/25/19 1151        An After Visit Summary was printed and given to the patient.    Kelana, [place], PA-C 12/25/19 1157    Drenda Freeze, MD 12/25/19 [phone]

## 2019-12-29 DIAGNOSIS — M25562 Pain in left knee: Secondary | ICD-10-CM | POA: Insufficient documentation

## 2019-12-29 HISTORY — DX: Pain in left knee: M25.562

## 2020-01-06 ENCOUNTER — Other Ambulatory Visit: Payer: Self-pay | Admitting: Physician Assistant

## 2020-01-06 ENCOUNTER — Other Ambulatory Visit (HOSPITAL_COMMUNITY): Payer: Self-pay | Admitting: Physician Assistant

## 2020-01-06 ENCOUNTER — Ambulatory Visit
Admission: RE | Admit: 2020-01-06 | Discharge: 2020-01-06 | Disposition: A | Discharge status: Home / Self Care | Payer: 59 | Source: Ambulatory Visit | Attending: Obstetrics & Gynecology | Admitting: Obstetrics & Gynecology

## 2020-01-06 DIAGNOSIS — E041 Nontoxic single thyroid nodule: Secondary | ICD-10-CM

## 2020-01-06 DIAGNOSIS — M25562 Pain in left knee: Secondary | ICD-10-CM

## 2020-01-06 DIAGNOSIS — E042 Nontoxic multinodular goiter: Secondary | ICD-10-CM | POA: Diagnosis not present

## 2020-01-06 MED FILL — MONTELUKAST SOD 10 MG TAB: 10 | 30 days supply | Qty: 30 | Fill #1

## 2020-01-06 MED FILL — AMLODIPINE BESYLATE 5 MG TA: 5 | 90 days supply | Qty: 90 | Fill #2

## 2020-01-06 MED FILL — LEVOCETIRIZINE 5 MG TABLET: 5 | 30 days supply | Qty: 30 | Fill #1

## 2020-01-06 MED FILL — traMADol HCL 50 MG TABS: 50 | 15 days supply | Qty: 60 | Fill #0

## 2020-01-07 ENCOUNTER — Other Ambulatory Visit: Payer: Self-pay | Admitting: *Deleted

## 2020-01-07 ENCOUNTER — Other Ambulatory Visit (HOSPITAL_BASED_OUTPATIENT_CLINIC_OR_DEPARTMENT_OTHER): Payer: Self-pay | Admitting: Physician Assistant

## 2020-01-07 DIAGNOSIS — M25562 Pain in left knee: Secondary | ICD-10-CM

## 2020-01-07 DIAGNOSIS — E041 Nontoxic single thyroid nodule: Secondary | ICD-10-CM

## 2020-01-12 DIAGNOSIS — H524 Presbyopia: Secondary | ICD-10-CM | POA: Diagnosis not present

## 2020-01-16 ENCOUNTER — Other Ambulatory Visit: Payer: Self-pay

## 2020-01-16 ENCOUNTER — Ambulatory Visit (HOSPITAL_BASED_OUTPATIENT_CLINIC_OR_DEPARTMENT_OTHER)
Admission: RE | Admit: 2020-01-16 | Discharge: 2020-01-16 | Disposition: A | Discharge status: Home / Self Care | Payer: 59 | Source: Ambulatory Visit | Attending: Physician Assistant | Admitting: Physician Assistant

## 2020-01-16 DIAGNOSIS — M25562 Pain in left knee: Secondary | ICD-10-CM | POA: Insufficient documentation

## 2020-01-16 DIAGNOSIS — S83412A Sprain of medial collateral ligament of left knee, initial encounter: Secondary | ICD-10-CM | POA: Diagnosis not present

## 2020-01-20 DIAGNOSIS — M25562 Pain in left knee: Secondary | ICD-10-CM | POA: Diagnosis not present

## 2020-01-20 DIAGNOSIS — S8392XA Sprain of unspecified site of left knee, initial encounter: Secondary | ICD-10-CM | POA: Diagnosis not present

## 2020-01-20 MED FILL — HYDROCODON-APAP 5-325: 5-325 | 5 days supply | Qty: 30 | Fill #0

## 2020-01-28 DIAGNOSIS — M25662 Stiffness of left knee, not elsewhere classified: Secondary | ICD-10-CM | POA: Diagnosis not present

## 2020-01-28 DIAGNOSIS — R2689 Other abnormalities of gait and mobility: Secondary | ICD-10-CM | POA: Diagnosis not present

## 2020-01-28 DIAGNOSIS — S83411D Sprain of medial collateral ligament of right knee, subsequent encounter: Secondary | ICD-10-CM | POA: Diagnosis not present

## 2020-01-28 DIAGNOSIS — M25462 Effusion, left knee: Secondary | ICD-10-CM | POA: Diagnosis not present

## 2020-01-29 ENCOUNTER — Other Ambulatory Visit: Payer: Self-pay | Admitting: Surgery

## 2020-01-29 DIAGNOSIS — E041 Nontoxic single thyroid nodule: Secondary | ICD-10-CM

## 2020-02-03 DIAGNOSIS — M25562 Pain in left knee: Secondary | ICD-10-CM | POA: Diagnosis not present

## 2020-02-03 DIAGNOSIS — S8392XA Sprain of unspecified site of left knee, initial encounter: Secondary | ICD-10-CM | POA: Diagnosis not present

## 2020-02-03 MED FILL — predniSONE 10 MG TABS: 10 | 6 days supply | Qty: 21 | Fill #0

## 2020-02-08 ENCOUNTER — Emergency Department (HOSPITAL_COMMUNITY): Payer: 59

## 2020-02-08 ENCOUNTER — Encounter (HOSPITAL_COMMUNITY): Payer: Self-pay | Admitting: Radiology

## 2020-02-08 ENCOUNTER — Emergency Department (HOSPITAL_COMMUNITY)
Admission: EM | Admit: 2020-02-08 | Discharge: 2020-02-09 | Disposition: A | Discharge status: Home / Self Care | Payer: 59 | Attending: Emergency Medicine | Admitting: Emergency Medicine

## 2020-02-08 DIAGNOSIS — R197 Diarrhea, unspecified: Secondary | ICD-10-CM | POA: Diagnosis not present

## 2020-02-08 DIAGNOSIS — R000 Tachycardia, unspecified: Secondary | ICD-10-CM | POA: Diagnosis not present

## 2020-02-08 DIAGNOSIS — R0789 Other chest pain: Secondary | ICD-10-CM | POA: Diagnosis not present

## 2020-02-08 DIAGNOSIS — R112 Nausea with vomiting, unspecified: Secondary | ICD-10-CM | POA: Diagnosis not present

## 2020-02-08 DIAGNOSIS — I10 Essential (primary) hypertension: Secondary | ICD-10-CM | POA: Diagnosis not present

## 2020-02-08 DIAGNOSIS — R079 Chest pain, unspecified: Secondary | ICD-10-CM

## 2020-02-08 DIAGNOSIS — R457 State of emotional shock and stress, unspecified: Secondary | ICD-10-CM | POA: Diagnosis not present

## 2020-02-08 DIAGNOSIS — Z87891 Personal history of nicotine dependence: Secondary | ICD-10-CM | POA: Diagnosis not present

## 2020-02-08 DIAGNOSIS — Z79899 Other long term (current) drug therapy: Secondary | ICD-10-CM | POA: Insufficient documentation

## 2020-02-08 DIAGNOSIS — R0602 Shortness of breath: Secondary | ICD-10-CM | POA: Diagnosis not present

## 2020-02-08 LAB — LIPASE, BLOOD: Lipase: 18 U/L (ref 11–51)

## 2020-02-08 LAB — CBC
HCT: 41.2 % (ref 36.0–46.0)
Hemoglobin: 13.6 g/dL (ref 12.0–15.0)
MCH: 31.8 pg (ref 26.0–34.0)
MCHC: 33 g/dL (ref 30.0–36.0)
MCV: 96.3 fL (ref 80.0–100.0)
Platelets: 394 10*3/uL (ref 150–400)
RBC: 4.28 MIL/uL (ref 3.87–5.11)
RDW: 13.9 % (ref 11.5–15.5)
WBC: 13.3 10*3/uL — ABNORMAL HIGH (ref 4.0–10.5)
nRBC: 0 % (ref 0.0–0.2)

## 2020-02-08 LAB — BASIC METABOLIC PANEL
Anion gap: 16 — ABNORMAL HIGH (ref 5–15)
BUN: 6 mg/dL (ref 6–20)
CO2: 17 mmol/L — ABNORMAL LOW (ref 22–32)
Calcium: 9.2 mg/dL (ref 8.9–10.3)
Chloride: 103 mmol/L (ref 98–111)
Creatinine, Ser: 0.89 mg/dL (ref 0.44–1.00)
GFR calc Af Amer: >60 mL/min (ref >60)
GFR calc non Af Amer: >60 mL/min (ref >60)
Glucose, Bld: 104 mg/dL — ABNORMAL HIGH (ref 70–99)
Potassium: 4.3 mmol/L (ref 3.5–5.1)
Sodium: 136 mmol/L (ref 135–145)

## 2020-02-08 LAB — TROPONIN I (HIGH SENSITIVITY)
Troponin I (High Sensitivity): 3 ng/L (ref <18)
Troponin I (High Sensitivity): 6 ng/L (ref <18)

## 2020-02-08 LAB — HEPATIC FUNCTION PANEL
ALT: 16 U/L (ref 0–44)
AST: 23 U/L (ref 15–41)
Albumin: 4.4 g/dL (ref 3.5–5.0)
Alkaline Phosphatase: 91 U/L (ref 38–126)
Bilirubin, Direct: 0.2 mg/dL (ref 0.0–0.2)
Indirect Bilirubin: 0.5 mg/dL (ref 0.3–0.9)
Total Bilirubin: 0.7 mg/dL (ref 0.3–1.2)
Total Protein: 7.5 g/dL (ref 6.5–8.1)

## 2020-02-08 MED ORDER — KETOROLAC TROMETHAMINE 30 MG/ML IJ SOLN
30.0000 mg | Freq: Once | INTRAMUSCULAR | Status: AC
  Administered 2020-02-08: 22:00:00 30 mg via INTRAVENOUS
  Filled 2020-02-08: qty 1

## 2020-02-08 MED ORDER — MORPHINE SULFATE (PF) 2 MG/ML IV SOLN
2.0000 mg | Freq: Once | INTRAVENOUS | Status: AC
  Administered 2020-02-08: 20:00:00 2 mg via INTRAVENOUS
  Filled 2020-02-08: qty 1

## 2020-02-08 MED ORDER — IOHEXOL 350 MG/ML SOLN
75.0000 mL | Freq: Once | INTRAVENOUS | Status: AC | PRN
  Administered 2020-02-08: 75 mL via INTRAVENOUS

## 2020-02-08 MED ORDER — SODIUM CHLORIDE 0.9 % IV BOLUS
1000.0000 mL | Freq: Once | INTRAVENOUS | Status: AC
  Administered 2020-02-08: 1000 mL via INTRAVENOUS

## 2020-02-08 MED ORDER — ONDANSETRON HCL 4 MG/2ML IJ SOLN
4.0000 mg | Freq: Once | INTRAMUSCULAR | Status: AC
  Administered 2020-02-08: 18:00:00 4 mg via INTRAVENOUS
  Filled 2020-02-08: qty 2

## 2020-02-08 MED ORDER — MORPHINE SULFATE (PF) 2 MG/ML IV SOLN
2.0000 mg | Freq: Once | INTRAVENOUS | Status: AC
  Administered 2020-02-08: 2 mg via INTRAVENOUS
  Filled 2020-02-08: qty 1

## 2020-02-08 MED ORDER — CYCLOBENZAPRINE HCL 10 MG PO TABS
10.0000 mg | ORAL_TABLET | Freq: Once | ORAL | Status: AC
  Administered 2020-02-08: 10 mg via ORAL
  Filled 2020-02-08: qty 1

## 2020-02-08 MED ORDER — ALUM & MAG HYDROXIDE-SIMETH 200-200-20 MG/5ML PO SUSP
30.0000 mL | Freq: Once | ORAL | Status: AC
  Administered 2020-02-08: 30 mL via ORAL
  Filled 2020-02-08: qty 30

## 2020-02-08 MED ORDER — PROCHLORPERAZINE EDISYLATE 10 MG/2ML IJ SOLN
10.0000 mg | Freq: Once | INTRAMUSCULAR | Status: AC
  Administered 2020-02-08: 10 mg via INTRAVENOUS
  Filled 2020-02-08: qty 2

## 2020-02-08 MED ORDER — LIDOCAINE VISCOUS HCL 2 % MT SOLN
15.0000 mL | Freq: Once | OROMUCOSAL | Status: AC
  Administered 2020-02-08: 22:00:00 15 mL via ORAL
  Filled 2020-02-08: qty 15

## 2020-02-08 MED ORDER — DIPHENHYDRAMINE HCL 50 MG/ML IJ SOLN
25.0000 mg | Freq: Once | INTRAMUSCULAR | Status: AC
  Administered 2020-02-08: 23:00:00 25 mg via INTRAVENOUS
  Filled 2020-02-08: qty 1

## 2020-02-08 MED ORDER — ONDANSETRON 4 MG PO TBDP
4.0000 mg | ORAL_TABLET | Freq: Once | ORAL | Status: AC
  Administered 2020-02-08: 4 mg via ORAL
  Filled 2020-02-08: qty 1

## 2020-02-08 NOTE — ED Notes (Signed)
Unable to obtain 2nd trop from IV already established. New IV to be placed by IV team for CT angio - will obtain blood at that time.

## 2020-02-08 NOTE — Progress Notes (Signed)
PIV consult: Site established by ED RN.

## 2020-02-08 NOTE — ED Provider Notes (Addendum)
[facility] EMERGENCY DEPARTMENT Provider Note   CSN: [id number] Arrival date & time: 02/08/20  1616     History Chief Complaint  Patient presents with  . Chest Pain   Stacy Berry is a 49 y.o. female.  Stacy Berry is a 49 yo F with a PMHx significant for hypertension, hyperlipidemia, ectopic pregnancy and fibroids s/p hysterectomy presenting via EMS from home with CP, SOB, N/V, diarrhea and abdominal cramping. Was given one 325 mg of ASA by EMS. EMS vitals were HR 70, RR 25, O2 100%, BP 160/90.   Pt reports she was in her usual state of health until around 11 AM when she woke up with nausea. She has vomited 4-5 times and has dry heaved as well. She denies bilious emesis or hematemesis. She reports her emesis is clear as she hasn't had anything to eat. Since throwing up she has developed R sided stabbing chest pain that radiates to her back. It is a 10/10 in severity. She can't tell if it is worse with exertion as she isn't able to walk due to dizziness. On ROS she endorses subjective fevers and chills. She denies sore throat but reports chronic rhinorrhea from allergies. She denies dysuria, myalgias, falls and syncope. She does endorse mild headache similar to her chronic headaches. She is concerned that her sx are related to drinking liquid marijuana and smoking marijuana last night. She has never had liquid marijuana before but has never had these sx with smoking marijuana a before.  Of note, she has received both doses of the COVID-19 vaccine.      Past Medical History:  Diagnosis Date  . Ectopic pregnancy   . Fibroid   . Hyperlipidemia   . Hypertension   . Infection    UTI  . Lipoma    Patient Active Problem List   Diagnosis Date Noted  . Essential hypertension 03/04/2018  . 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 Leiomyoma of uterus, unspecified 05/11/2013  . Lipoma of abdominal wall 09/03/2012   Past Surgical History:  Procedure  Laterality Date  . ECTOPIC PREGNANCY SURGERY  1990  . ENDOMETRIAL ABLATION    . GYNECOLOGIC CRYOSURGERY    . NSVD  1992. 1995. 1998. 2000  . ROBOTIC ASSISTED TOTAL HYSTERECTOMY Right 06/15/2013   Procedure: ROBOTIC ASSISTED TOTAL HYSTERECTOMY AND RIGHT SALINGECTOMY;  Surgeon: Lahoma Crocker, MD;  Location: [facility];  Service: Gynecology;  Laterality: Right;     OB History    Gravida  5   Para  4   Term  4   Preterm      AB  1   Living  4     SAB      TAB      Ectopic  1   Multiple      Live Births  4           Family History  Problem Relation Age of Onset  . Breast cancer Mother   . Prostate cancer Father   . Cancer Paternal Grandfather   . Breast cancer Maternal Grandmother   . Diabetes Maternal Grandfather   . Breast cancer Sister     Social History   Tobacco Use  . Smoking status: Former Smoker    Packs/day: 0.01    Years: 10.00    Pack years: 0.10  . Smokeless tobacco: Never Used  Substance Use Topics  . Alcohol use: Yes    Comment: occasional  . Drug use: No  Drank liquid marijuana last night. Smoked marijuana last night. 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 Authorizing Provider  amLODipine (NORVASC) 5 MG tablet Take 5 mg by mouth daily. 09/11/19   [provider]  diclofenac (VOLTAREN) 75 MG EC tablet Take 1 tablet (75 mg total) by mouth 2 (two) times daily. 12/25/19   Fransico Meadow, PA-C  levocetirizine (XYZAL) 5 MG tablet  05/08/19   [provider]  montelukast (SINGULAIR) 10 MG tablet  05/08/19   [provider]  simvastatin (ZOCOR) 10 MG tablet Take 1 tablet (10 mg total) by mouth at bedtime. Patient not taking: Reported on 12/01/2019 05/28/19 12/25/19  Dorothyann Peng, NP    Allergies    Cocoa  Review of Systems   Review of Systems  Constitutional: Positive for chills and fever.  HENT: Positive for rhinorrhea. Negative  for sore throat.   Respiratory: Positive for shortness of breath.   Cardiovascular: Positive for chest pain.  Gastrointestinal: Positive for diarrhea, nausea and vomiting. Negative for abdominal pain.  Genitourinary: Negative for dysuria.  Musculoskeletal: Negative for arthralgias.  Skin: Negative for rash.  Neurological: Positive for dizziness.   Physical Exam Updated Vital Signs BP (!) 147/91   Pulse 97   Temp 98.1 F (36.7 C) (Oral)   Resp 20   LMP 05/29/2013   SpO2 100%   Physical Exam Vitals and nursing note reviewed.  Constitutional:      Appearance: She is well-developed.  HENT:     Head: Normocephalic and atraumatic.  Cardiovascular:     Rate and Rhythm: Normal rate and regular rhythm.     Heart sounds: Normal heart sounds.  Pulmonary:     Effort: Pulmonary effort is normal.     Breath sounds: No decreased breath sounds, wheezing, rhonchi or rales.  Abdominal:     Palpations: Abdomen is soft.     Tenderness: There is abdominal tenderness.  Musculoskeletal:     Cervical back: Normal range of motion.  Skin:    General: Skin is warm and dry.     Findings: No rash.  Neurological:     General: No focal deficit present.     Mental Status: She is alert and oriented to person, place, and time.    ED Results / Procedures / Treatments   Labs (all labs ordered are listed, but only abnormal results are displayed) Labs Reviewed  BASIC METABOLIC PANEL - Abnormal; Notable for the following components:      Result Value   CO2 17 (*)    Glucose, Bld 104 (*)    Anion gap 16 (*)    All other components within normal limits  CBC - Abnormal; Notable for the following components:   WBC 13.3 (*)    All other components within normal limits  HEPATIC FUNCTION PANEL  LIPASE, BLOOD  URINALYSIS, ROUTINE W REFLEX MICROSCOPIC  URINALYSIS, COMPLETE (UACMP) WITH MICROSCOPIC  TROPONIN I (HIGH SENSITIVITY)  TROPONIN I (HIGH SENSITIVITY)    EKG EKG  Interpretation  Date/Time:  Monday Feb 08 2020 16:23:02 EDT Ventricular Rate:  99 PR Interval:    QRS Duration: 86 QT Interval:  383 QTC Calculation: 492 R Axis:   83 Text Interpretation: Sinus rhythm Borderline T wave abnormalities Borderline prolonged QT interval No significant change since last tracing Confirmed by Gareth Morgan [phone]) on 02/08/2020 4:27:37 PM  Radiology DG Chest 2 View  Result Date: 02/08/2020  CLINICAL DATA:  Chest pain EXAM: CHEST - 2 VIEW COMPARISON:  07/16/2019 FINDINGS: Heart and mediastinal contours are within normal limits. No focal opacities or effusions. No acute bony abnormality. IMPRESSION: No active cardiopulmonary disease. Electronically Signed   By: Rolm Baptise M.D.   On: 02/08/2020 18:45   CT Angio Chest PE W and/or Wo Contrast  Result Date: 02/08/2020 CLINICAL DATA:  Shortness of breath, chest pain EXAM: CT ANGIOGRAPHY CHEST WITH CONTRAST TECHNIQUE: Multidetector CT imaging of the chest was performed using the standard protocol during bolus administration of intravenous contrast. Multiplanar CT image reconstructions and MIPs were obtained to evaluate the vascular anatomy. CONTRAST:  63mL OMNIPAQUE IOHEXOL 350 MG/ML SOLN COMPARISON:  Thyroid ultrasound 01/06/2020 FINDINGS: Cardiovascular: No filling defects in the pulmonary arteries to suggest pulmonary emboli. Heart is normal size. Aorta is normal caliber. Mediastinum/Nodes: No mediastinal, hilar, or axillary adenopathy. Trachea and esophagus are unremarkable. 1.4 cm nodule in the right thyroid lobe. This has been documented on prior thyroid studies (ref: J Am Coll Radiol. 2015 Feb;12(2): 143-50). Lungs/Pleura: Lungs are clear. No focal airspace opacities or suspicious nodules. No effusions. Upper Abdomen: Imaging into the upper abdomen shows no acute findings. Musculoskeletal: 2 Chest wall soft tissues are unremarkable. No acute bony abnormality. Review of the MIP images confirms the above findings. IMPRESSION:  No evidence of pulmonary embolus. No acute cardiopulmonary disease. Electronically Signed   By: Rolm Baptise M.D.   On: 02/08/2020 21:26    Procedures Procedures (including critical care time)  Medications Ordered in ED Medications  ondansetron (ZOFRAN-ODT) disintegrating tablet 4 mg (4 mg Oral Given 02/08/20 1715)  morphine 2 MG/ML injection 2 mg (2 mg Intravenous Given 02/08/20 1800)  ondansetron (ZOFRAN) injection 4 mg (4 mg Intravenous Given 02/08/20 1800)  morphine 2 MG/ML injection 2 mg (2 mg Intravenous Given 02/08/20 2010)  iohexol (OMNIPAQUE) 350 MG/ML injection 75 mL (75 mLs Intravenous Contrast Given 02/08/20 2101)  sodium chloride 0.9 % bolus 1,000 mL (1,000 mLs Intravenous New Bag/Given 02/08/20 2211)  ketorolac (TORADOL) 30 MG/ML injection 30 mg (30 mg Intravenous Given 02/08/20 2210)  cyclobenzaprine (FLEXERIL) tablet 10 mg (10 mg Oral Given 02/08/20 2211)  alum & mag hydroxide-simeth (MAALOX/MYLANTA) 200-200-20 MG/5ML suspension 30 mL (30 mLs Oral Given 02/08/20 2210)    And  lidocaine (XYLOCAINE) 2 % viscous mouth solution 15 mL (15 mLs Oral Given 02/08/20 2211)  prochlorperazine (COMPAZINE) injection 10 mg (10 mg Intravenous Given 02/08/20 2321)  diphenhydrAMINE (BENADRYL) injection 25 mg (25 mg Intravenous Given 02/08/20 2321)    ED Course  I have reviewed the triage vital signs and the nursing notes.  Pertinent labs & imaging results that were available during my care of the patient were reviewed by me and considered in my medical decision making (see chart for details).    MDM Rules/Calculators/A&P                      Ms. Tkachenko is a 49 yo F with a PMHx significant for hypertension, hyperlipidemia, ectopic pregnancy and fibroids s/p hysterectomy here with chest pain, SOB, N/V and diarrhea.  Chest Pain: -pt presenting with 10/10 r sided stabbing chest pain that radiates to her back after multiple bouts of nausea/vomiting -she is unable to say if it is exertional as she gets dizzy  when she stands -she has never had pain like this before -EKG without acute ischemic changes, trops 3 --> 6, normal chest x-ray -pt w/ leukocytosis of 13, negative lipase,  unremarkable BMP, normal HFP -low HP Risk Score of 3 so concern for ACS low; cardiac tamponade unlikely as patient hypertensive and normal voltages on EKG; chest pain improved with 2 mg morphine -Aortic Dissection is of some concern given hypertension and stabbing chest pain radiating to the back, although pain began after multiple episodes of emesis; normal mediastinal contours on chest x-ray -PE also of concern given SOB and a hx of immobilization following knee injury although pt not tachycardic and satting well on room air -No tension pneumothorax visualized on x-ray and pulm exam unremarkable -mediastinitis/esophageal rupture unlikely with unremarkable chest x-ray and stable vitals -no overlying skin rashes consistent with herpes zoster or cellulitis -hopefully this is secondary to multiple episodes of emesis although SOB somewhat inconsistent with this picture -chest pain not improved or exacerbated by movement and not tender to palpation so unlikely MSK  Plan: -four extremity blood pressures -CTA Chest PE w/wo - unremarkable no PE or concern for dissection   N/V/Diarrhea: -pt with sudden onset non bloody non bilious N/V/Diarrhea this AM with reports of subjective fever and chills -pt does have WBC of 13 -no sick contacts but this could be an infectious etiology w/ WBC and subjective fever/chills -could be related to marijuana use as she reports somewhat regular use since her knee injury   Plan: -zofran and fluids  Final Clinical Impression(s) / ED Diagnoses Final diagnoses:  Chest pain, unspecified type  Nausea and vomiting, intractability of vomiting not specified, unspecified vomiting type  Shortness of breath  Diarrhea, unspecified type   -plan for discharge home after symptomatic management  Rx / DC  Orders ED Discharge Orders    None       Al Decant, MD 02/08/20 Janus Molder    Al Decant, MD 02/08/20 [id number]    Gareth Morgan, MD 02/09/20 0003

## 2020-02-08 NOTE — ED Notes (Signed)
IV team at bedside attempting [facility] access. Aware need for 2nd trop.

## 2020-02-08 NOTE — ED Triage Notes (Signed)
Pt brought in via EMS from home for CP, SOB, and numbness/tingling in fingers and toes bilaterally. Pt was given 324mg  ASA en route. Pt also states she has had nausea/vomiting/diarrhea with abdominal cramping.

## 2020-02-09 DIAGNOSIS — R0602 Shortness of breath: Secondary | ICD-10-CM | POA: Diagnosis not present

## 2020-02-09 DIAGNOSIS — R197 Diarrhea, unspecified: Secondary | ICD-10-CM | POA: Diagnosis not present

## 2020-02-09 DIAGNOSIS — Z79899 Other long term (current) drug therapy: Secondary | ICD-10-CM | POA: Diagnosis not present

## 2020-02-09 DIAGNOSIS — R112 Nausea with vomiting, unspecified: Secondary | ICD-10-CM | POA: Diagnosis not present

## 2020-02-09 DIAGNOSIS — Z87891 Personal history of nicotine dependence: Secondary | ICD-10-CM | POA: Diagnosis not present

## 2020-02-09 DIAGNOSIS — R0789 Other chest pain: Secondary | ICD-10-CM | POA: Diagnosis not present

## 2020-02-09 DIAGNOSIS — I10 Essential (primary) hypertension: Secondary | ICD-10-CM | POA: Diagnosis not present

## 2020-02-09 MED ORDER — ONDANSETRON 4 MG PO TBDP
4.0000 mg | ORAL_TABLET | Freq: Three times a day (TID) | ORAL | 0 refills | Status: DC | PRN
Start: 2020-02-09 — End: 2020-03-03

## 2020-02-09 MED ORDER — CYCLOBENZAPRINE HCL 10 MG PO TABS
10.0000 mg | ORAL_TABLET | Freq: Two times a day (BID) | ORAL | 0 refills | Status: DC | PRN
Start: 2020-02-09 — End: 2020-03-24

## 2020-02-09 MED FILL — CYCLOBENZAPRINE HCL 10 MG T: 10 | 10 days supply | Qty: 20 | Fill #0

## 2020-02-09 MED FILL — ONDANSETRON ODT 4 MG TABLET: 4 | 7 days supply | Qty: 20 | Fill #0

## 2020-02-09 NOTE — ED Notes (Signed)
Patient verbalizes understanding of discharge instructions. Opportunity for questioning and answers were provided. Armband removed by staff, pt discharged from ED ambulatory.   

## 2020-02-10 ENCOUNTER — Other Ambulatory Visit (HOSPITAL_COMMUNITY)
Admission: RE | Admit: 2020-02-10 | Discharge: 2020-02-10 | Disposition: A | Discharge status: Home / Self Care | Payer: 59 | Source: Ambulatory Visit | Attending: Surgery | Admitting: Surgery

## 2020-02-10 ENCOUNTER — Ambulatory Visit
Admission: RE | Admit: 2020-02-10 | Discharge: 2020-02-10 | Disposition: A | Discharge status: Home / Self Care | Payer: 59 | Source: Ambulatory Visit | Attending: Surgery | Admitting: Surgery

## 2020-02-10 DIAGNOSIS — E041 Nontoxic single thyroid nodule: Secondary | ICD-10-CM

## 2020-02-11 LAB — CYTOLOGY - NON PAP

## 2020-02-12 MED FILL — LEVOCETIRIZINE 5 MG TABLET: 5 | 30 days supply | Qty: 30 | Fill #2

## 2020-02-12 MED FILL — traMADol HCL 50 MG TABS: 50 | 5 days supply | Qty: 30 | Fill #0

## 2020-02-12 MED FILL — MONTELUKAST SOD 10 MG TAB: 10 | 30 days supply | Qty: 30 | Fill #2

## 2020-02-16 DIAGNOSIS — E041 Nontoxic single thyroid nodule: Secondary | ICD-10-CM | POA: Diagnosis not present

## 2020-02-22 DIAGNOSIS — M25562 Pain in left knee: Secondary | ICD-10-CM | POA: Diagnosis not present

## 2020-02-22 DIAGNOSIS — S8392XA Sprain of unspecified site of left knee, initial encounter: Secondary | ICD-10-CM | POA: Diagnosis not present

## 2020-02-26 ENCOUNTER — Telehealth: Payer: Self-pay | Admitting: *Deleted

## 2020-02-26 NOTE — Telephone Encounter (Signed)
Call to patient. Patient advised needing to schedule TOC appointment. Patient asking about thyroid biopsy results? States she had done at [facility], ordered by Dr. Harlow Asa advised would be contacted by Dr. Gala Lewandowsky office with results. Patient scheduled for OV on 03-03-20 at 1530 with Dr. Sabra Heck for TOC. Patient agreeable to date and time of appointment.   Routing to provider and will close encounter.

## 2020-02-26 NOTE — Telephone Encounter (Signed)
-----   Message from Megan Salon, MD sent at 02/22/2020  4:31 AM EDT ----- Regarding: trich follow up Stacy Berry, This pt was seen in Feb and had trich on her vaginitis swab.  She did not come back for one month follow up.  Would you call her about the follow up.  I would just like documentation that we tried to get her back for follow up.  Thanks.  Vinnie Level

## 2020-02-29 NOTE — Progress Notes (Signed)
GYNECOLOGY  VISIT  CC:   TOC for trichomonas, questions about thyroid pathology  HPI: 49 y.o. [id number] Single Black or African American female here for TOC of trichomonas that was diagnosed 12/01/2019.  Pt is no longer with partner and is not currently SA.  Declines any additional testing today.  Denies vaginal discharge or vaginal bleeding.  Denies pelvic pain.  Thyroid nodule was noted at exam in February as well.  Ultrasound was performed.  Biopsy recommended.  She was referred to Dr. Harlow Asa.  Biopsy is completed but pt has not been called with results.  Has questions about this today.  Atypia noted but no malignancy.  Pt was advised would be several weeks but results are back so doesn't really understand why hasn't gotten a call.  Additional testing on thyroid specimen often done with intermediate pathology.  This is a send out lab and is likely being done to determine whether surgery is needed or not.  Voiced clear understanding.  Will let me know if hasn't hear in another week or two.  GYNECOLOGIC HISTORY: Patient's last menstrual period was 05/29/2013. Contraception: hysterectomy Menopausal hormone therapy: none  Patient Active Problem List   Diagnosis Date Noted  . Essential hypertension 03/04/2018  . 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 Leiomyoma of uterus, unspecified 05/11/2013  . Lipoma of abdominal wall 09/03/2012    Past Medical History:  Diagnosis Date  . Ectopic pregnancy   . Fibroid   . Hyperlipidemia   . Hypertension   . Infection    UTI  . Lipoma     Past Surgical History:  Procedure Laterality Date  . ECTOPIC PREGNANCY SURGERY  1990  . ENDOMETRIAL ABLATION    . GYNECOLOGIC CRYOSURGERY    . NSVD  1992. 1995. 1998. 2000  . ROBOTIC ASSISTED TOTAL HYSTERECTOMY Right 06/15/2013   Procedure: ROBOTIC ASSISTED TOTAL HYSTERECTOMY AND RIGHT SALINGECTOMY;  Surgeon: Lahoma Crocker, MD;  Location: [facility];  Service: Gynecology;  Laterality: Right;    MEDS:    Current Outpatient Medications on File Prior to Visit  Medication Sig Dispense Refill  . amLODipine (NORVASC) 5 MG tablet Take 5 mg by mouth daily.    . cyclobenzaprine (FLEXERIL) 10 MG tablet Take 1 tablet (10 mg total) by mouth 2 (two) times daily as needed for muscle spasms. 20 tablet 0  . levocetirizine (XYZAL) 5 MG tablet     . montelukast (SINGULAIR) 10 MG tablet     . [DISCONTINUED] simvastatin (ZOCOR) 10 MG tablet Take 1 tablet (10 mg total) by mouth at bedtime. (Patient not taking: Reported on 12/01/2019) 90 tablet 3   No current facility-administered medications on file prior to visit.    ALLERGIES: Cocoa  Family History  Problem Relation Age of Onset  . Breast cancer Mother   . Prostate cancer Father   . Cancer Paternal Grandfather   . Breast cancer Maternal Grandmother   . Diabetes Maternal Grandfather   . Breast cancer Sister     SH:  Divorced, smoker  Review of Systems  Constitutional: Negative.   HENT: Negative.   Eyes: Negative.   Respiratory: Negative.   Cardiovascular: Negative.   Gastrointestinal: Negative.   Endocrine: Negative.   Genitourinary: Negative.   Musculoskeletal: Negative.   Skin: Negative.   Allergic/Immunologic: Negative.   Neurological: Negative.   Psychiatric/Behavioral: Negative.     PHYSICAL EXAMINATION:    Wt 200 lb (90.7 kg)   LMP 05/29/2013   BMI 33.28 kg/m  General appearance: alert, cooperative and appears stated age Lymph:  no inguinal LAD noted  Pelvic: External genitalia:  no lesions              Urethra:  normal appearing urethra with no masses, tenderness or lesions              Bartholins and Skenes: normal                 Vagina: normal appearing vagina with normal color and discharge, no lesions              Cervix: no lesions              Bimanual Exam:  Uterus:  normal size, contour, position, consistency, mobility, non-tender              Adnexa: no mass, fullness, tenderness              Chaperone,  Terence Lux, CMA, was present for exam.  Assessment: History of trichomonas, here for TOC today Thyroid nodule with bipsy 02/10/2020 and questions about results*  Plan: Affirm obtained.  Results will be communicated to pt.  To best of my knowledge, explained about thyroid cytology results and likely additional testing being performed to help determine if surgery will be needed.  HIV negative 05/2019    About 20 minutes in total spent with pt with physical exam and in discussion of above concerns

## 2020-03-03 ENCOUNTER — Ambulatory Visit (INDEPENDENT_AMBULATORY_CARE_PROVIDER_SITE_OTHER): Payer: 59 | Admitting: Obstetrics & Gynecology

## 2020-03-03 ENCOUNTER — Other Ambulatory Visit: Payer: Self-pay

## 2020-03-03 ENCOUNTER — Encounter: Payer: Self-pay | Admitting: Obstetrics & Gynecology

## 2020-03-03 VITALS — BP 110/72 | HR 70 | Temp 97.0°F | Resp 16 | Wt 200.0 lb

## 2020-03-03 DIAGNOSIS — E041 Nontoxic single thyroid nodule: Secondary | ICD-10-CM

## 2020-03-03 DIAGNOSIS — Z8619 Personal history of other infectious and parasitic diseases: Secondary | ICD-10-CM

## 2020-03-03 HISTORY — DX: Personal history of other infectious and parasitic diseases: Z86.19

## 2020-03-04 LAB — VAGINITIS/VAGINOSIS, DNA PROBE
Candida Species: NEGATIVE
Gardnerella vaginalis: NEGATIVE
Trichomonas vaginosis: NEGATIVE

## 2020-03-04 MED FILL — traMADol HCL 50 MG TABS: 50 | 5 days supply | Qty: 30 | Fill #0

## 2020-03-07 ENCOUNTER — Encounter (HOSPITAL_COMMUNITY): Payer: Self-pay

## 2020-03-10 ENCOUNTER — Encounter (HOSPITAL_COMMUNITY): Payer: Self-pay | Admitting: Interventional Radiology

## 2020-03-24 ENCOUNTER — Encounter (HOSPITAL_COMMUNITY): Payer: Self-pay

## 2020-03-24 ENCOUNTER — Ambulatory Visit (HOSPITAL_COMMUNITY)
Admission: EM | Admit: 2020-03-24 | Discharge: 2020-03-24 | Disposition: A | Discharge status: Home / Self Care | Payer: 59 | Attending: Emergency Medicine | Admitting: Emergency Medicine

## 2020-03-24 DIAGNOSIS — Z139 Encounter for screening, unspecified: Secondary | ICD-10-CM | POA: Diagnosis not present

## 2020-03-24 DIAGNOSIS — E041 Nontoxic single thyroid nodule: Secondary | ICD-10-CM | POA: Diagnosis not present

## 2020-03-24 DIAGNOSIS — M7989 Other specified soft tissue disorders: Secondary | ICD-10-CM | POA: Diagnosis not present

## 2020-03-24 DIAGNOSIS — J019 Acute sinusitis, unspecified: Secondary | ICD-10-CM

## 2020-03-24 MED ORDER — PREDNISONE 20 MG PO TABS: 40.0000 mg | ORAL_TABLET | Freq: Every day | ORAL | 0 refills | Status: AC

## 2020-03-24 MED ORDER — AMOXICILLIN-POT CLAVULANATE 875-125 MG PO TABS: 1.0000 | ORAL_TABLET | Freq: Two times a day (BID) | ORAL | 0 refills | Status: AC

## 2020-03-24 MED FILL — LEVOCETIRIZINE 5 MG TABLET: 5 | 30 days supply | Qty: 30 | Fill #3

## 2020-03-24 MED FILL — AMOX-CLAV 875-125 MG TABLET: 875-125 | 7 days supply | Qty: 14 | Fill #0

## 2020-03-24 MED FILL — predniSONE 20 MG TABS: 20 | 5 days supply | Qty: 10 | Fill #0

## 2020-03-24 MED FILL — MONTELUKAST SOD 10 MG TAB: 10 | 30 days supply | Qty: 30 | Fill #3

## 2020-03-24 NOTE — Discharge Instructions (Signed)
Begin Augmentin twice daily for the next week Prednisone 40 mg daily with food, take in the morning if you are able Continue regular allergy medicines Rest and drink plenty of fluids Please follow-up if not improving or worsening

## 2020-03-24 NOTE — ED Triage Notes (Signed)
Pt c/o roof of mouth itching, sore throat, ears itching, dry coughx3 days. Pt states its her sinuses.

## 2020-03-24 NOTE — ED Provider Notes (Signed)
[place]    CSN: [id number] Arrival date & time: 03/24/20  1007      History   Chief Complaint Chief Complaint  Patient presents with  . sinus issues    HPI Stacy Berry is a 49 y.o. female history of hypertension, hyperlipidemia, presenting today for evaluation of sinus symptoms.  Patient reports that over the past 3 days she has had increased throat irritation, sinus pressure and burning ear itching and a dry cough.  She has history of allergic rhinitis and takes Xyzal and Singulair daily.  Over the past 3 days she has felt her symptoms worsen and has developed increased discomfort.  Feels similar to prior sinus infections.  Denies fevers.  Denies chest pain shortness of breath.  HPI  Past Medical History:  Diagnosis Date  . Ectopic pregnancy   . Fibroid   . Hyperlipidemia   . Hypertension   . Infection    UTI  . Lipoma     Patient Active Problem List   Diagnosis Date Noted  . History of trichomoniasis 03/03/2020  . Essential hypertension 03/04/2018  . 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 Leiomyoma of uterus, unspecified 05/11/2013  . Lipoma of abdominal wall 09/03/2012    Past Surgical History:  Procedure Laterality Date  . ECTOPIC PREGNANCY SURGERY  1990  . ENDOMETRIAL ABLATION    . GYNECOLOGIC CRYOSURGERY    . NSVD  1992. 1995. 1998. 2000  . ROBOTIC ASSISTED TOTAL HYSTERECTOMY Right 06/15/2013   Procedure: ROBOTIC ASSISTED TOTAL HYSTERECTOMY AND RIGHT SALINGECTOMY;  Surgeon: Lahoma Crocker, MD;  Location: [facility];  Service: Gynecology;  Laterality: Right;    OB History    Gravida  5   Para  4   Term  4   Preterm      AB  1   Living  4     SAB      TAB      Ectopic  1   Multiple      Live Births  4            Home Medications    Prior to Admission medications   Medication Sig Start Date End Date Taking? Authorizing Provider  amLODipine (NORVASC) 5 MG tablet Take 5 mg by mouth daily. 09/11/19    [provider]  amoxicillin-clavulanate (AUGMENTIN) 875-125 MG tablet Take 1 tablet by mouth every 12 (twelve) hours for 7 days. 03/24/20 03/31/20  Vanetta Rule C, PA-C  levocetirizine (XYZAL) 5 MG tablet  05/08/19   [provider]  montelukast (SINGULAIR) 10 MG tablet  05/08/19   [provider]  predniSONE (DELTASONE) 20 MG tablet Take 2 tablets (40 mg total) by mouth daily with breakfast for 5 days. 03/24/20 03/29/20  Jdyn Parkerson C, PA-C  simvastatin (ZOCOR) 10 MG tablet Take 1 tablet (10 mg total) by mouth at bedtime. Patient not taking: Reported on 12/01/2019 05/28/19 12/25/19  Dorothyann Peng, NP    Family History Family History  Problem Relation Age of Onset  . Breast cancer Mother   . Prostate cancer Father   . Cancer Paternal Grandfather   . Breast cancer Maternal Grandmother   . Diabetes Maternal Grandfather   . Breast cancer Sister     Social History Social History   Tobacco Use  . Smoking status: Former Smoker    Packs/day: 0.01    Years: 10.00    Pack years: 0.10  . Smokeless tobacco: Never Used  Vaping Use  .  Vaping Use: Never used  Substance Use Topics  . Alcohol use: Yes    Comment: occasional  . Drug use: No     Allergies   Cocoa   Review of Systems Review of Systems  Constitutional: Negative for activity change, appetite change, chills, fatigue and fever.  HENT: Positive for congestion, rhinorrhea, sinus pressure and sore throat. Negative for ear pain and trouble swallowing.   Eyes: Negative for discharge and redness.  Respiratory: Positive for cough. Negative for chest tightness and shortness of breath.   Cardiovascular: Negative for chest pain.  Gastrointestinal: Negative for abdominal pain, diarrhea, nausea and vomiting.  Musculoskeletal: Negative for myalgias.  Skin: Negative for rash.  Neurological: Negative for dizziness, light-headedness and headaches.     Physical Exam Triage Vital Signs ED Triage Vitals  [03/24/20 1038]  Enc Vitals Group     BP (!) 148/109     Pulse Rate (!) 103     Resp 16     Temp 98.9 F (37.2 C)     Temp Source Oral     SpO2 98 %     Weight 191 lb (86.6 kg)     Height 5\' 5"  (1.651 m)     Head Circumference      Peak Flow      Pain Score 8     Pain Loc      Pain Edu?      Excl. in [place]?    No data found.  Updated Vital Signs BP (!) 148/109   Pulse (!) 103   Temp 98.9 F (37.2 C) (Oral)   Resp 16   Ht 5\' 5"  (1.651 m)   Wt 191 lb (86.6 kg)   LMP 05/29/2013   SpO2 98%   BMI 31.78 kg/m   Visual Acuity Right Eye Distance:   Left Eye Distance:   Bilateral Distance:    Right Eye Near:   Left Eye Near:    Bilateral Near:     Physical Exam Vitals and nursing note reviewed.  Constitutional:      Appearance: She is well-developed.     Comments: No acute distress  HENT:     Head: Normocephalic and atraumatic.     Ears:     Comments: Bilateral TMs appear opaque, but good cone of light, nonerythematous    Nose: Nose normal.     Comments: Nasal mucosa mildly erythematous with swollen turbinates bilaterally    Mouth/Throat:     Comments: Oral mucosa pink and moist, no tonsillar enlargement or exudate. Posterior pharynx patent and nonerythematous, no uvula deviation or swelling. Normal phonation. Eyes:     Conjunctiva/sclera: Conjunctivae normal.  Cardiovascular:     Rate and Rhythm: Normal rate.  Pulmonary:     Effort: Pulmonary effort is normal. No respiratory distress.     Comments: Breathing comfortably at rest, CTABL, no wheezing, rales or other adventitious sounds auscultated Abdominal:     General: There is no distension.  Musculoskeletal:        General: Normal range of motion.     Cervical back: Neck supple.  Skin:    General: Skin is warm and dry.  Neurological:     Mental Status: She is alert and oriented to person, place, and time.      UC Treatments / Results  Labs (all labs ordered are listed, but only abnormal results are  displayed) Labs Reviewed - No data to display  EKG   Radiology No results found.  Procedures Procedures (  including critical care time)  Medications Ordered in UC Medications - No data to display  Initial Impression / Assessment and Plan / UC Course  I have reviewed the triage vital signs and the nursing notes.  Pertinent labs & imaging results that were available during my care of the patient were reviewed by me and considered in my medical decision making (see chart for details).     Allergy symptoms with recent worsening, will treat for sinusitis with Augmentin, prednisone, continue regular allergy meds.  Rest and fluids.  Discussed strict return precautions. Patient verbalized understanding and is agreeable with plan.  Final Clinical Impressions(s) / UC Diagnoses   Final diagnoses:  Acute sinusitis with symptoms > 10 days     Discharge Instructions     Begin Augmentin twice daily for the next week Prednisone 40 mg daily with food, take in the morning if you are able Continue regular allergy medicines Rest and drink plenty of fluids Please follow-up if not improving or worsening    ED Prescriptions    Medication Sig Dispense Auth. Provider   amoxicillin-clavulanate (AUGMENTIN) 875-125 MG tablet Take 1 tablet by mouth every 12 (twelve) hours for 7 days. 14 tablet Decarlos Empey C, PA-C   predniSONE (DELTASONE) 20 MG tablet Take 2 tablets (40 mg total) by mouth daily with breakfast for 5 days. 10 tablet Karl Knarr, [place] C, PA-C     PDMP not reviewed this encounter.   Janith Lima, PA-C 03/24/20 1104

## 2020-04-05 ENCOUNTER — Ambulatory Visit: Payer: Self-pay | Admitting: Surgery

## 2020-04-28 ENCOUNTER — Other Ambulatory Visit (HOSPITAL_COMMUNITY): Payer: 59

## 2020-04-28 MED FILL — LEVOCETIRIZINE 5 MG TABLET: 5 | 30 days supply | Qty: 30 | Fill #0

## 2020-05-06 ENCOUNTER — Other Ambulatory Visit (HOSPITAL_COMMUNITY): Payer: Self-pay | Admitting: Allergy

## 2020-05-06 DIAGNOSIS — F17200 Nicotine dependence, unspecified, uncomplicated: Secondary | ICD-10-CM | POA: Diagnosis not present

## 2020-05-06 DIAGNOSIS — J3089 Other allergic rhinitis: Secondary | ICD-10-CM | POA: Diagnosis not present

## 2020-05-06 DIAGNOSIS — R05 Cough: Secondary | ICD-10-CM | POA: Diagnosis not present

## 2020-05-06 DIAGNOSIS — H1045 Other chronic allergic conjunctivitis: Secondary | ICD-10-CM | POA: Diagnosis not present

## 2020-05-06 MED FILL — ALBUTEROL SULFATE HFA 108 (: 108 (90 BAS | 16 days supply | Qty: 18 | Fill #0

## 2020-05-06 MED FILL — AEROCHAMBER: 30 days supply | Qty: 1 | Fill #0

## 2020-05-06 MED FILL — FLUTICASONE PROP 50 MCG SPR: 50 | 30 days supply | Qty: 16 | Fill #0

## 2020-05-06 MED FILL — AZELASTINE HCL 137 MCG SPRY: 0.1 | 25 days supply | Qty: 30 | Fill #0

## 2020-05-06 MED FILL — MONTELUKAST SOD 10 MG TAB: 10 | 30 days supply | Qty: 30 | Fill #0

## 2020-05-06 MED FILL — AZELASTINE HCL 0.05% DROPS: 0.05 | 30 days supply | Qty: 6 | Fill #0

## 2020-05-09 ENCOUNTER — Other Ambulatory Visit: Payer: Self-pay | Admitting: Adult Health

## 2020-05-11 NOTE — Telephone Encounter (Signed)
LMOM for a return call.  Pt needs to be scheduled for a cpx.

## 2020-05-12 NOTE — Telephone Encounter (Signed)
LMOM for a return call.  Pt needs to be scheduled for a cpx.

## 2020-05-16 MED FILL — AMLODIPINE BESYLATE 5 MG TA: 5 | 30 days supply | Qty: 30 | Fill #0

## 2020-05-16 NOTE — Telephone Encounter (Signed)
SENT IN FOR 30 DAYS.  PT DUE FOR CPX AND LAB WORK

## 2020-06-08 ENCOUNTER — Encounter (HOSPITAL_COMMUNITY)
Admission: RE | Admit: 2020-06-08 | Discharge: 2020-06-08 | Disposition: A | Discharge status: Home / Self Care | Payer: 59 | Source: Ambulatory Visit | Attending: Surgery | Admitting: Surgery

## 2020-06-08 ENCOUNTER — Other Ambulatory Visit: Payer: Self-pay

## 2020-06-08 ENCOUNTER — Encounter (HOSPITAL_COMMUNITY): Payer: Self-pay

## 2020-06-08 DIAGNOSIS — Z01810 Encounter for preprocedural cardiovascular examination: Secondary | ICD-10-CM | POA: Diagnosis not present

## 2020-06-08 DIAGNOSIS — Z01812 Encounter for preprocedural laboratory examination: Secondary | ICD-10-CM | POA: Diagnosis not present

## 2020-06-08 LAB — CBC
HCT: 40.6 % (ref 36.0–46.0)
Hemoglobin: 13.2 g/dL (ref 12.0–15.0)
MCH: 31.5 pg (ref 26.0–34.0)
MCHC: 32.5 g/dL (ref 30.0–36.0)
MCV: 96.9 fL (ref 80.0–100.0)
Platelets: 381 10*3/uL (ref 150–400)
RBC: 4.19 MIL/uL (ref 3.87–5.11)
RDW: 14.3 % (ref 11.5–15.5)
WBC: 10.4 10*3/uL (ref 4.0–10.5)
nRBC: 0 % (ref 0.0–0.2)

## 2020-06-08 LAB — BASIC METABOLIC PANEL
Anion gap: 11 (ref 5–15)
BUN: 7 mg/dL (ref 6–20)
CO2: 23 mmol/L (ref 22–32)
Calcium: 8.8 mg/dL — ABNORMAL LOW (ref 8.9–10.3)
Chloride: 105 mmol/L (ref 98–111)
Creatinine, Ser: 0.81 mg/dL (ref 0.44–1.00)
GFR calc Af Amer: >60 mL/min (ref >60)
GFR calc non Af Amer: >60 mL/min (ref >60)
Glucose, Bld: 89 mg/dL (ref 70–99)
Potassium: 3.8 mmol/L (ref 3.5–5.1)
Sodium: 139 mmol/L (ref 135–145)

## 2020-06-08 MED FILL — AMLODIPINE BESYLATE 5 MG TA: 5 | 30 days supply | Qty: 30 | Fill #0

## 2020-06-08 NOTE — Pre-Procedure Instructions (Addendum)
NATSUMI WHITSITT  06/08/2020      [place], [place] - [address] [facility] [address] [place] [address] Phone: [phone] Fax: [phone]    Your procedure is scheduled on Sept.7  Report to [facility] Entrance A at 7:30 A.M.  Call this number if you have problems the morning of surgery:  [phone]   Remember:  Do not eat or drink after midnight.    Take these medicines the morning of surgery with A SIP OF WATER : none                If needed:  Albuterol inhaler--bring to hospital                                     Eye drops                               7 days prior to surgery STOP taking any Aspirin (unless otherwise instructed by your surgeon), Aleve, Naproxen, Ibuprofen, Motrin, Advil, Goody's, BC's, all herbal medications, fish oil, and all vitamins.                 Follow your surgeon's instructions on when to stop Aspirin.  If no instructions were given by your surgeon then you will need to call the office to get those instructions.      Do not wear jewelry, make-up or nail polish.  Do not wear lotions, powders, or perfumes, or deodorant.  Do not shave 48 hours prior to surgery.  Men may shave face and neck.  Do not bring valuables to the hospital.  [facility] is not responsible for any belongings or valuables.  Contacts, dentures or bridgework may not be worn into surgery.  Leave your suitcase in the car.  After surgery it may be brought to your room.  For patients admitted to the hospital, discharge time will be determined by your treatment team.  Patients discharged the day of surgery will not be allowed to drive home.    Special instructions:  [place]- Preparing For Surgery  Before surgery, you can play an important role. Because skin is not sterile, your skin needs to be as free of germs as possible. You can reduce the number of germs on your skin by washing with CHG (chlorahexidine gluconate)  Soap before surgery.  CHG is an antiseptic cleaner which kills germs and bonds with the skin to continue killing germs even after washing.    Oral Hygiene is also important to reduce your risk of infection.  Remember - BRUSH YOUR TEETH THE MORNING OF SURGERY WITH YOUR REGULAR TOOTHPASTE  Please do not use if you have an allergy to CHG or antibacterial soaps. If your skin becomes reddened/irritated stop using the CHG.  Do not shave (including legs and underarms) for at least 48 hours prior to first CHG shower. It is OK to shave your face.  Please follow these instructions carefully.   1. Shower the NIGHT BEFORE SURGERY and the MORNING OF SURGERY with CHG.   2. If you chose to wash your hair, wash your hair first as usual with your normal shampoo.  3. After you shampoo, rinse your hair and body thoroughly to remove the shampoo.  4. Use CHG as you would any other liquid soap. You  can apply CHG directly to the skin and wash gently with a scrungie or a clean washcloth.   5. Apply the CHG Soap to your body ONLY FROM THE NECK DOWN.  Do not use on open wounds or open sores. Avoid contact with your eyes, ears, mouth and genitals (private parts). Wash Face and genitals (private parts)  with your normal soap.  6. Wash thoroughly, paying special attention to the area where your surgery will be performed.  7. Thoroughly rinse your body with warm water from the neck down.  8. DO NOT shower/wash with your normal soap after using and rinsing off the CHG Soap.  9. Pat yourself dry with a CLEAN TOWEL.  10. Wear CLEAN PAJAMAS to bed the night before surgery, wear comfortable clothes the morning of surgery  11. Place CLEAN SHEETS on your bed the night of your first shower and DO NOT SLEEP WITH PETS.    Day of Surgery:  Do not apply any deodorants/lotions.  Please wear clean clothes to the hospital/surgery center.   Remember to brush your teeth WITH YOUR REGULAR TOOTHPASTE.    Please read over  the following fact sheets that you were given.

## 2020-06-08 NOTE — Progress Notes (Addendum)
PCP - Herbie Drape. States she in in process of changing doctors Cardiologist - na   Chest x-ray - 02/08/20 EKG - 02/08/20 Stress Test - na ECHO - na Cardiac Cath - na  Sleep Study - na   Fasting Blood Sugar - na   Blood Thinner Instructions: Aspirin Instructions: yes, instructed to stop   COVID TEST- 06/10/20   Anesthesia review: blood pressure, 179/112, 197/127/181/104, Pt. States she ran out of blood pressure meds 3 days ago. pt. C/o of chest pain couple weeks ago. Stated it lasted for a few hours, stated it was a aching  Pain, took ibuprofen., went home laid down and it went away.  Patient denies shortness of breath, fever, cough and chest pain at PAT appointment   All instructions explained to the patient, with a verbal understanding of the material. Patient agrees to go over the instructions while at home for a better understanding. Patient also instructed to self quarantine after being tested for COVID-19. The opportunity to ask questions was provided.
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
## 2020-06-09 NOTE — Anesthesia Preprocedure Evaluation (Addendum)
Anesthesia Evaluation  Patient identified by MRN, date of birth, ID band Patient awake    Reviewed: Allergy & Precautions, NPO status , Patient's Chart, lab work & pertinent test results  Airway Mallampati: II  TM Distance: >3 FB Neck ROM: Full    Dental no notable dental hx. (+) Teeth Intact, Dental Advisory Given,    Pulmonary former smoker,    Pulmonary exam normal breath sounds clear to auscultation       Cardiovascular hypertension, Pt. on medications Normal cardiovascular exam Rhythm:Regular Rate:Normal   ED visit on 02/08/11 for right sided chest pain and lightheadedness that began after N/V/D. + Chest wall tenderness. Notes indicate she had drank liquid marijuana the night before which she had never done and was concerned this was causing her symptoms. EKG stable. Troponins negative x2, CTA negative for PE. No concern for ACS. Discharged home.   Very poorly controlled HTN- pt admitted in PAT to stopping her BP meds and using cannabis to lower BP instead, restarted amlodipine about 1 week ago. Does not currently have PCP. Bps still elevated in preop, although diastolic slightly lower than it was at PAT appt    Neuro/Psych negative neurological ROS  negative psych ROS   GI/Hepatic negative GI ROS, (+)     substance abuse (last used a week ago )  marijuana use,   Endo/Other  Obesity BMI 35  Renal/GU negative Renal ROS  negative genitourinary   Musculoskeletal negative musculoskeletal ROS (+)   Abdominal (+) + obese,   Peds  Hematology negative hematology ROS (+)   Anesthesia Other Findings Right flank mass  Reproductive/Obstetrics negative OB ROS                          Anesthesia Physical Anesthesia Plan  ASA: III  Anesthesia Plan: General   Post-op Pain Management:    Induction: Intravenous  PONV Risk Score and Plan: Ondansetron, Dexamethasone, Midazolam and Treatment may vary  due to age or medical condition  Airway Management Planned: LMA  Additional Equipment: None  Intra-op Plan:   Post-operative Plan: Extubation in OR  Informed Consent: I have reviewed the patients History and Physical, chart, labs and discussed the procedure including the risks, benefits and alternatives for the proposed anesthesia with the patient or authorized representative who has indicated his/her understanding and acceptance.     Dental advisory given  Plan Discussed with: CRNA  Anesthesia Plan Comments: (Advised pt she needs to re-establish care with a PCP immediately for BP management, she agrees. She did take her amlodipine this morning.)     Anesthesia Quick Evaluation

## 2020-06-10 ENCOUNTER — Other Ambulatory Visit (HOSPITAL_COMMUNITY)
Admission: RE | Admit: 2020-06-10 | Discharge: 2020-06-10 | Disposition: A | Discharge status: Home / Self Care | Payer: 59 | Source: Ambulatory Visit | Attending: Surgery | Admitting: Surgery

## 2020-06-10 DIAGNOSIS — Z20822 Contact with and (suspected) exposure to covid-19: Secondary | ICD-10-CM | POA: Insufficient documentation

## 2020-06-10 DIAGNOSIS — Z01812 Encounter for preprocedural laboratory examination: Secondary | ICD-10-CM | POA: Insufficient documentation

## 2020-06-10 LAB — SARS CORONAVIRUS 2 (TAT 6-24 HRS): SARS Coronavirus 2: NEGATIVE

## 2020-06-14 ENCOUNTER — Ambulatory Visit (HOSPITAL_COMMUNITY): Payer: 59 | Admitting: Vascular Surgery

## 2020-06-14 ENCOUNTER — Encounter (HOSPITAL_COMMUNITY): Payer: Self-pay | Admitting: Surgery

## 2020-06-14 ENCOUNTER — Ambulatory Visit (HOSPITAL_COMMUNITY): Payer: 59 | Admitting: Certified Registered"

## 2020-06-14 ENCOUNTER — Ambulatory Visit (HOSPITAL_COMMUNITY): Payer: 59 | Admitting: Anesthesiology

## 2020-06-14 ENCOUNTER — Encounter (HOSPITAL_COMMUNITY)
Admission: RE | Disposition: A | Discharge status: Home / Self Care | Payer: Self-pay | Source: Home / Self Care | Attending: Surgery

## 2020-06-14 ENCOUNTER — Ambulatory Visit (HOSPITAL_COMMUNITY)
Admission: RE | Admit: 2020-06-14 | Discharge: 2020-06-14 | Disposition: A | Discharge status: Home / Self Care | Payer: 59 | Attending: Surgery | Admitting: Surgery

## 2020-06-14 ENCOUNTER — Other Ambulatory Visit: Payer: Self-pay

## 2020-06-14 DIAGNOSIS — E669 Obesity, unspecified: Secondary | ICD-10-CM | POA: Insufficient documentation

## 2020-06-14 DIAGNOSIS — R222 Localized swelling, mass and lump, trunk: Secondary | ICD-10-CM | POA: Diagnosis not present

## 2020-06-14 DIAGNOSIS — Z87892 Personal history of anaphylaxis: Secondary | ICD-10-CM | POA: Diagnosis not present

## 2020-06-14 DIAGNOSIS — Z91013 Allergy to seafood: Secondary | ICD-10-CM | POA: Insufficient documentation

## 2020-06-14 DIAGNOSIS — I10 Essential (primary) hypertension: Secondary | ICD-10-CM | POA: Diagnosis not present

## 2020-06-14 DIAGNOSIS — R229 Localized swelling, mass and lump, unspecified: Secondary | ICD-10-CM | POA: Diagnosis present

## 2020-06-14 DIAGNOSIS — Z87891 Personal history of nicotine dependence: Secondary | ICD-10-CM | POA: Insufficient documentation

## 2020-06-14 DIAGNOSIS — D171 Benign lipomatous neoplasm of skin and subcutaneous tissue of trunk: Secondary | ICD-10-CM | POA: Insufficient documentation

## 2020-06-14 DIAGNOSIS — D259 Leiomyoma of uterus, unspecified: Secondary | ICD-10-CM | POA: Diagnosis not present

## 2020-06-14 DIAGNOSIS — Z91018 Allergy to other foods: Secondary | ICD-10-CM | POA: Insufficient documentation

## 2020-06-14 DIAGNOSIS — Z6835 Body mass index (BMI) 35.0-35.9, adult: Secondary | ICD-10-CM | POA: Insufficient documentation

## 2020-06-14 HISTORY — PX: MASS EXCISION: SHX2000

## 2020-06-14 SURGERY — EXCISION MASS
Anesthesia: General | Laterality: Right

## 2020-06-14 SURGERY — EXCISION MASS
Anesthesia: General | Site: Flank | Laterality: Right

## 2020-06-14 MED ORDER — KETOROLAC TROMETHAMINE 30 MG/ML IJ SOLN
INTRAMUSCULAR | Status: AC
  Filled 2020-06-14: qty 1

## 2020-06-14 MED ORDER — BUPIVACAINE-EPINEPHRINE 0.5% -1:200000 IJ SOLN
INTRAMUSCULAR | Status: AC
  Filled 2020-06-14: qty 1

## 2020-06-14 MED ORDER — OXYCODONE HCL 5 MG/5ML PO SOLN: 5.0000 mg | Freq: Once | ORAL | Status: DC | PRN

## 2020-06-14 MED ORDER — IBUPROFEN 800 MG PO TABS: 800.0000 mg | ORAL_TABLET | Freq: Three times a day (TID) | ORAL | 1 refills | Status: DC

## 2020-06-14 MED ORDER — PHENYLEPHRINE HCL (PRESSORS) 10 MG/ML IV SOLN
INTRAVENOUS | Status: DC | PRN
  Administered 2020-06-14: 120 ug via INTRAVENOUS
  Administered 2020-06-14: 80 ug via INTRAVENOUS
  Administered 2020-06-14: 40 ug via INTRAVENOUS
  Administered 2020-06-14 (×2): 80 ug via INTRAVENOUS

## 2020-06-14 MED ORDER — MIDAZOLAM HCL 5 MG/5ML IJ SOLN
INTRAMUSCULAR | Status: DC | PRN
  Administered 2020-06-14: 2 mg via INTRAVENOUS

## 2020-06-14 MED ORDER — FENTANYL CITRATE (PF) 100 MCG/2ML IJ SOLN: 25.0000 ug | INTRAMUSCULAR | Status: DC | PRN

## 2020-06-14 MED ORDER — PROMETHAZINE HCL 25 MG/ML IJ SOLN: 6.2500 mg | INTRAMUSCULAR | Status: DC | PRN

## 2020-06-14 MED ORDER — DEXMEDETOMIDINE (PRECEDEX) IN NS 20 MCG/5ML (4 MCG/ML) IV SYRINGE
PREFILLED_SYRINGE | INTRAVENOUS | Status: AC
  Filled 2020-06-14: qty 5

## 2020-06-14 MED ORDER — PROMETHAZINE HCL 25 MG/ML IJ SOLN
INTRAMUSCULAR | Status: AC
  Filled 2020-06-14: qty 1

## 2020-06-14 MED ORDER — FENTANYL CITRATE (PF) 100 MCG/2ML IJ SOLN
INTRAMUSCULAR | Status: DC | PRN
Start: 2020-06-14 — End: 2020-06-14
  Administered 2020-06-14: 100 ug via INTRAVENOUS
  Administered 2020-06-14 (×2): 50 ug via INTRAVENOUS

## 2020-06-14 MED ORDER — SUGAMMADEX SODIUM 200 MG/2ML IV SOLN
INTRAVENOUS | Status: DC | PRN
  Administered 2020-06-14: 400 mg via INTRAVENOUS

## 2020-06-14 MED ORDER — BUPIVACAINE HCL (PF) 0.5 % IJ SOLN
INTRAMUSCULAR | Status: AC
  Filled 2020-06-14: qty 30

## 2020-06-14 MED ORDER — MEPERIDINE HCL 25 MG/ML IJ SOLN: 6.2500 mg | INTRAMUSCULAR | Status: DC | PRN

## 2020-06-14 MED ORDER — LACTATED RINGERS IV SOLN: INTRAVENOUS | Status: DC

## 2020-06-14 MED ORDER — ACETAMINOPHEN 500 MG PO TABS
1000.0000 mg | ORAL_TABLET | Freq: Once | ORAL | Status: AC
  Administered 2020-06-14: 1000 mg via ORAL
  Filled 2020-06-14: qty 2

## 2020-06-14 MED ORDER — OXYCODONE HCL 5 MG PO TABS
5.0000 mg | ORAL_TABLET | Freq: Three times a day (TID) | ORAL | 0 refills | Status: DC | PRN
Start: 2020-06-14 — End: 2020-12-08

## 2020-06-14 MED ORDER — CEFAZOLIN SODIUM-DEXTROSE 2-4 GM/100ML-% IV SOLN
2.0000 g | INTRAVENOUS | Status: AC
  Administered 2020-06-14: 2 g via INTRAVENOUS
  Filled 2020-06-14: qty 100

## 2020-06-14 MED ORDER — DEXAMETHASONE SODIUM PHOSPHATE 10 MG/ML IJ SOLN
INTRAMUSCULAR | Status: DC | PRN
  Administered 2020-06-14: 10 mg via INTRAVENOUS

## 2020-06-14 MED ORDER — BUPIVACAINE-EPINEPHRINE 0.5% -1:200000 IJ SOLN
INTRAMUSCULAR | Status: DC | PRN
  Administered 2020-06-14: 50 mL

## 2020-06-14 MED ORDER — ONDANSETRON HCL 4 MG/2ML IJ SOLN
INTRAMUSCULAR | Status: DC | PRN
  Administered 2020-06-14: 4 mg via INTRAVENOUS

## 2020-06-14 MED ORDER — PHENYLEPHRINE 40 MCG/ML (10ML) SYRINGE FOR IV PUSH (FOR BLOOD PRESSURE SUPPORT)
PREFILLED_SYRINGE | INTRAVENOUS | Status: AC
  Filled 2020-06-14: qty 10

## 2020-06-14 MED ORDER — HYDROMORPHONE HCL 1 MG/ML IJ SOLN
INTRAMUSCULAR | Status: AC
  Filled 2020-06-14: qty 1

## 2020-06-14 MED ORDER — FENTANYL CITRATE (PF) 250 MCG/5ML IJ SOLN
INTRAMUSCULAR | Status: AC
  Filled 2020-06-14: qty 5

## 2020-06-14 MED ORDER — LIDOCAINE 2% (20 MG/ML) 5 ML SYRINGE
INTRAMUSCULAR | Status: AC
  Filled 2020-06-14: qty 5

## 2020-06-14 MED ORDER — KETOROLAC TROMETHAMINE 30 MG/ML IJ SOLN
30.0000 mg | Freq: Once | INTRAMUSCULAR | Status: AC | PRN
  Administered 2020-06-14: 30 mg via INTRAVENOUS

## 2020-06-14 MED ORDER — 0.9 % SODIUM CHLORIDE (POUR BTL) OPTIME
TOPICAL | Status: DC | PRN
  Administered 2020-06-14: 1000 mL

## 2020-06-14 MED ORDER — DEXAMETHASONE SODIUM PHOSPHATE 10 MG/ML IJ SOLN
INTRAMUSCULAR | Status: AC
  Filled 2020-06-14: qty 1

## 2020-06-14 MED ORDER — OXYCODONE HCL 5 MG PO TABS: 5.0000 mg | ORAL_TABLET | Freq: Once | ORAL | Status: DC | PRN

## 2020-06-14 MED ORDER — ENOXAPARIN SODIUM 40 MG/0.4ML ~~LOC~~ SOLN
40.0000 mg | Freq: Once | SUBCUTANEOUS | Status: DC
  Filled 2020-06-14: qty 0.4

## 2020-06-14 MED ORDER — CHLORHEXIDINE GLUCONATE CLOTH 2 % EX PADS: 6.0000 | MEDICATED_PAD | Freq: Once | CUTANEOUS | Status: DC

## 2020-06-14 MED ORDER — LIDOCAINE HCL (CARDIAC) PF 100 MG/5ML IV SOSY
PREFILLED_SYRINGE | INTRAVENOUS | Status: DC | PRN
  Administered 2020-06-14: 60 mg via INTRAVENOUS

## 2020-06-14 MED ORDER — BUPIVACAINE-EPINEPHRINE 0.5% -1:200000 IJ SOLN
INTRAMUSCULAR | Status: DC | PRN
  Administered 2020-06-14: 40 mL

## 2020-06-14 MED ORDER — CHLORHEXIDINE GLUCONATE 0.12 % MT SOLN
15.0000 mL | Freq: Once | OROMUCOSAL | Status: AC
  Administered 2020-06-14: 15 mL via OROMUCOSAL
  Filled 2020-06-14: qty 15

## 2020-06-14 MED ORDER — ROCURONIUM BROMIDE 100 MG/10ML IV SOLN
INTRAVENOUS | Status: DC | PRN
  Administered 2020-06-14: 60 mg via INTRAVENOUS

## 2020-06-14 MED ORDER — PROPOFOL 10 MG/ML IV BOLUS
INTRAVENOUS | Status: AC
  Filled 2020-06-14: qty 20

## 2020-06-14 MED ORDER — DEXMEDETOMIDINE (PRECEDEX) IN NS 20 MCG/5ML (4 MCG/ML) IV SYRINGE
PREFILLED_SYRINGE | INTRAVENOUS | Status: DC | PRN
  Administered 2020-06-14 (×2): 8 ug via INTRAVENOUS

## 2020-06-14 MED ORDER — PROPOFOL 10 MG/ML IV BOLUS
INTRAVENOUS | Status: DC | PRN
  Administered 2020-06-14: 200 mg via INTRAVENOUS

## 2020-06-14 MED ORDER — ORAL CARE MOUTH RINSE: 15.0000 mL | Freq: Once | OROMUCOSAL | Status: AC

## 2020-06-14 MED ORDER — PROMETHAZINE HCL 25 MG/ML IJ SOLN
6.2500 mg | INTRAMUSCULAR | Status: AC | PRN
  Administered 2020-06-14 (×2): 6.25 mg via INTRAVENOUS

## 2020-06-14 MED ORDER — MIDAZOLAM HCL 2 MG/2ML IJ SOLN
INTRAMUSCULAR | Status: AC
  Filled 2020-06-14: qty 2

## 2020-06-14 MED ORDER — ONDANSETRON HCL 4 MG/2ML IJ SOLN
INTRAMUSCULAR | Status: AC
  Filled 2020-06-14: qty 2

## 2020-06-14 MED ORDER — DEXMEDETOMIDINE (PRECEDEX) IN NS 20 MCG/5ML (4 MCG/ML) IV SYRINGE
PREFILLED_SYRINGE | INTRAVENOUS | Status: AC
  Filled 2020-06-14: qty 10

## 2020-06-14 MED ORDER — HYDROMORPHONE HCL 1 MG/ML IJ SOLN
0.5000 mg | INTRAMUSCULAR | Status: AC | PRN
  Administered 2020-06-14 (×2): 0.5 mg via INTRAVENOUS

## 2020-06-14 MED ORDER — HYDROMORPHONE HCL 1 MG/ML IJ SOLN
0.2500 mg | INTRAMUSCULAR | Status: DC | PRN
  Administered 2020-06-14: 0.5 mg via INTRAVENOUS
  Administered 2020-06-14 (×2): 0.25 mg via INTRAVENOUS
  Administered 2020-06-14 (×2): 0.5 mg via INTRAVENOUS

## 2020-06-14 MED FILL — IBUPROFEN 800 MG TABS: 800 | 30 days supply | Qty: 90 | Fill #0

## 2020-06-14 MED FILL — oxyCODONE HCL 5 MG TABS: 5 | 4 days supply | Qty: 12 | Fill #0

## 2020-06-14 SURGICAL SUPPLY — 31 items
ADH SKN CLS APL DERMABOND .7 (GAUZE/BANDAGES/DRESSINGS) ×1
APL PRP STRL LF DISP 70% ISPRP (MISCELLANEOUS) ×1
CANISTER SUCT 3000ML PPV (MISCELLANEOUS) IMPLANT
CHLORAPREP W/TINT 26 (MISCELLANEOUS) ×2 IMPLANT
COVER SURGICAL LIGHT HANDLE (MISCELLANEOUS) ×2 IMPLANT
COVER WAND RF STERILE (DRAPES) ×2 IMPLANT
DERMABOND ADVANCED (GAUZE/BANDAGES/DRESSINGS) ×1
DERMABOND ADVANCED .7 DNX12 (GAUZE/BANDAGES/DRESSINGS) IMPLANT
DRAPE LAPAROTOMY 100X72 PEDS (DRAPES) ×2 IMPLANT
ELECT REM PT RETURN 9FT ADLT (ELECTROSURGICAL) ×2
ELECTRODE REM PT RTRN 9FT ADLT (ELECTROSURGICAL) ×1 IMPLANT
GAUZE SPONGE 4X4 12PLY STRL (GAUZE/BANDAGES/DRESSINGS) IMPLANT
GOWN STRL REUS W/ TWL LRG LVL3 (GOWN DISPOSABLE) ×1 IMPLANT
GOWN STRL REUS W/TWL LRG LVL3 (GOWN DISPOSABLE) ×4
KIT BASIN OR (CUSTOM PROCEDURE TRAY) ×2 IMPLANT
KIT TURNOVER KIT B (KITS) ×2 IMPLANT
NDL HYPO 25X1 1.5 SAFETY (NEEDLE) ×1 IMPLANT
NEEDLE HYPO 25X1 1.5 SAFETY (NEEDLE) ×2 IMPLANT
NS IRRIG 1000ML POUR BTL (IV SOLUTION) ×2 IMPLANT
PACK GENERAL/GYN (CUSTOM PROCEDURE TRAY) ×2 IMPLANT
PAD ARMBOARD 7.5X6 YLW CONV (MISCELLANEOUS) ×2 IMPLANT
SPECIMEN JAR SMALL (MISCELLANEOUS) ×2 IMPLANT
STRIP CLOSURE SKIN 1/2X4 (GAUZE/BANDAGES/DRESSINGS) IMPLANT
SUT MNCRL AB 4-0 PS2 18 (SUTURE) ×2 IMPLANT
SUT VIC AB 2-0 SH 18 (SUTURE) ×1 IMPLANT
SUT VIC AB 3-0 SH 18 (SUTURE) ×1 IMPLANT
SUT VIC AB 3-0 SH 27 (SUTURE) ×2
SUT VIC AB 3-0 SH 27X BRD (SUTURE) ×1 IMPLANT
SYR CONTROL 10ML LL (SYRINGE) ×2 IMPLANT
TOWEL GREEN STERILE (TOWEL DISPOSABLE) ×2 IMPLANT
TOWEL GREEN STERILE FF (TOWEL DISPOSABLE) ×2 IMPLANT

## 2020-06-14 NOTE — Transfer of Care (Signed)
Immediate Anesthesia Transfer of Care Note  Patient: Stacy Berry  Procedure(s) Performed: EXCISION OF SOFT TISSUE MASS RIGHT FLANK (Right Flank)  Patient Location: PACU  Anesthesia Type:General  Level of Consciousness: drowsy  Airway & Oxygen Therapy: Patient Spontanous Breathing and Patient connected to nasal cannula oxygen  Post-op Assessment: Report given to RN, Post -op Vital signs reviewed and stable and Patient moving all extremities  Post vital signs: Reviewed and stable  Last Vitals:  Vitals Value Taken Time  BP 161/108 06/14/20 1058  Temp 36.3 C 06/14/20 1058  Pulse 99 06/14/20 1102  Resp 27 06/14/20 1102  SpO2 97 % 06/14/20 1102  Vitals shown include unvalidated device data.  Last Pain:  Vitals:   06/14/20 1058  TempSrc:   PainSc: 0-No pain      Patients Stated Pain Goal: 3 (49/49/44 7395)  Complications: No complications documented.

## 2020-06-14 NOTE — Progress Notes (Addendum)
Patient seen in recovery area. Persistent bulge at right flank noted. Counseled patient that this is due to remaining normal subcutaneous tissues coupled with edema and local anesthetic infiltrate and recommended observation and re-evaluation after 1-2 weeks with possible revision at that time if she remains unhappy. Patient expressed desire to return to OR today for revision. Patient posted for re-exploration in OR, but just before going back to OR, patient elected not to proceed due to the additional costs incurred by return trip to OR. At the time of this evaluation, the patient verbalized constant dull pressure in her left chest and is requesting an EKG to ensure she is not having a heart attack. EKG ordered and will review. Patient offered the option of admission for 24h observation given the stresses of the day and her new chest pain. EKG reviewed and NSR with prolonged QTc. Compared with EKGs obtained in PAT on 06/08/2020 and in May 2021, this is stable. Patient declines cardiac biomarker labwork and admission after reviewing EKG.  Jesusita Oka, MD General and [facility]

## 2020-06-14 NOTE — Anesthesia Postprocedure Evaluation (Signed)
Anesthesia Post Note  Patient: Stacy Berry  Procedure(s) Performed: EXCISION OF SOFT TISSUE MASS RIGHT FLANK (Right Flank)     Patient location during evaluation: PACU Anesthesia Type: General Level of consciousness: awake and alert, oriented and patient cooperative Pain management: pain level controlled Vital Signs Assessment: post-procedure vital signs reviewed and stable Respiratory status: spontaneous breathing, nonlabored ventilation and respiratory function stable Cardiovascular status: blood pressure returned to baseline and stable Postop Assessment: no apparent nausea or vomiting Anesthetic complications: no   No complications documented.  Last Vitals:  Vitals:   06/14/20 1115 06/14/20 1130  BP: (!) 156/94 (!) 161/101  Pulse: 78 85  Resp: 19 16  Temp:    SpO2: 96% 99%    Last Pain:  Vitals:   06/14/20 1145  TempSrc:   PainSc: [place]

## 2020-06-14 NOTE — Anesthesia Procedure Notes (Signed)
Procedure Name: Intubation Date/Time: 06/14/2020 10:07 AM Performed by: Shadonna Benedick T, CRNA Pre-anesthesia Checklist: Patient identified, Emergency Drugs available, Suction available and Patient being monitored Patient Re-evaluated:Patient Re-evaluated prior to induction Oxygen Delivery Method: Circle system utilized Preoxygenation: Pre-oxygenation with 100% oxygen Induction Type: IV induction Ventilation: Mask ventilation without difficulty Laryngoscope Size: Mac and 3 Grade View: Grade I Tube type: Oral Tube size: 7.0 mm Number of attempts: 1 Airway Equipment and Method: Stylet Placement Confirmation: ETT inserted through vocal cords under direct vision,  positive ETCO2 and breath sounds checked- equal and bilateral Tube secured with: Tape Dental Injury: Teeth and Oropharynx as per pre-operative assessment  Comments: Placed by Dyann Ruddle

## 2020-06-14 NOTE — H&P (Signed)
     Stacy Berry is an 49 y.o. female.   HPI: 45F with R flank soft tissue mass presents for excision. The patient has had no hospitalizations, unplanned doctors visits, ER visits, or surgeries since being seen in clinic. Has noticed some sensitivity to shrimp recently causing throat swelling. Non-adherent to anti-hypertensive meds for what she reports as one week, restarted norvasc 9/1.  Past Medical History:  Diagnosis Date  . Ectopic pregnancy   . Fibroid   . Hyperlipidemia   . Hypertension   . Infection    UTI  . Lipoma     Past Surgical History:  Procedure Laterality Date  . ECTOPIC PREGNANCY SURGERY  1990  . ENDOMETRIAL ABLATION    . GYNECOLOGIC CRYOSURGERY    . NSVD  1992. 1995. 1998. 2000  . ROBOTIC ASSISTED TOTAL HYSTERECTOMY Right 06/15/2013   Procedure: ROBOTIC ASSISTED TOTAL HYSTERECTOMY AND RIGHT SALINGECTOMY;  Surgeon: Lahoma Crocker, MD;  Location: [facility];  Service: Gynecology;  Laterality: Right;    Family History  Problem Relation Age of Onset  . Breast cancer Mother   . Prostate cancer Father   . Cancer Paternal Grandfather   . Breast cancer Maternal Grandmother   . Diabetes Maternal Grandfather   . Breast cancer Sister     Social History:  reports that she has quit smoking. She has a 0.10 pack-year smoking history. She has never used smokeless tobacco. She reports current alcohol use. She reports that she does not use drugs.  Allergies:  Allergies  Allergen Reactions  . Shellfish Allergy Anaphylaxis  . Cocoa Hives    Medications: I have reviewed the patient's current medications.  No results found for this or any previous visit (from the past 48 hour(s)).  No results found.  ROS 10 point review of systems is negative except as listed above in HPI.   Physical Exam Blood pressure (!) 166/106, pulse 90, temperature 97.7 F (36.5 C), temperature source Temporal, resp. rate 18, height 5\' 4"  (1.626 m), weight 90.7 kg, last menstrual  period 05/29/2013, SpO2 97 %. Constitutional: well-developed, well-nourished HEENT: pupils equal, round, reactive to light, 75mm b/l, moist conjunctiva, external inspection of ears and nose normal, hearing intact Oropharynx: normal oropharyngeal mucosa, normal dentition Neck: no thyromegaly, trachea midline, no midline cervical tenderness to palpation Chest: breath sounds equal bilaterally, normal respiratory effort, no midline or lateral chest wall tenderness to palpation/deformity Abdomen: soft, NT, no bruising, no hepatosplenomegaly GU: normal female genitalia  Back: no wounds, no thoracic/lumbar spine tenderness to palpation, no thoracic/lumbar spine stepoffs, R flank mass Rectal: deferred Extremities: 2+ radial and pedal pulses bilaterally, motor and sensation intact to bilateral UE and LE, no peripheral edema MSK: normal gait/station, no clubbing/cyanosis of fingers/toes, normal ROM of all four extremities Skin: warm, dry, no rashes Psych: normal memory, normal mood/affect    Assessment/Plan: 52F with right flank mass presents for excision. Informed consent was obtained after detailed explanation of risks, including bleeding, infection, hematoma/seroma, and recurrence. To OR this AM pending blood pressure.   Jesusita Oka, MD General and [facility]

## 2020-06-14 NOTE — Anesthesia Preprocedure Evaluation (Signed)
Anesthesia Evaluation  Patient identified by MRN, date of birth, ID band Patient awake    Reviewed: Allergy & Precautions, NPO status , Patient's Chart, lab work & pertinent test results  Airway Mallampati: II  TM Distance: >3 FB Neck ROM: Full    Dental no notable dental hx. (+) Teeth Intact, Dental Advisory Given,    Pulmonary former smoker,    Pulmonary exam normal breath sounds clear to auscultation       Cardiovascular hypertension, Pt. on medications Normal cardiovascular exam Rhythm:Regular Rate:Normal   ED visit on 02/08/11 for right sided chest pain and lightheadedness that began after N/V/D. + Chest wall tenderness. Notes indicate she had drank liquid marijuana the night before which she had never done and was concerned this was causing her symptoms. EKG stable. Troponins negative x2, CTA negative for PE. No concern for ACS. Discharged home.   Very poorly controlled HTN- pt admitted in PAT to stopping her BP meds and using cannabis to lower BP instead, restarted amlodipine about 1 week ago. Does not currently have PCP. Bps still elevated in preop, although diastolic slightly lower than it was at PAT appt    Neuro/Psych negative neurological ROS  negative psych ROS   GI/Hepatic negative GI ROS, (+)     substance abuse (last used a week ago )  marijuana use,   Endo/Other  Obesity BMI 35  Renal/GU negative Renal ROS  negative genitourinary   Musculoskeletal negative musculoskeletal ROS (+)   Abdominal (+) + obese,   Peds  Hematology negative hematology ROS (+)   Anesthesia Other Findings Right flank mass  Reproductive/Obstetrics negative OB ROS                             Anesthesia Physical  Anesthesia Plan  ASA: III  Anesthesia Plan: General   Post-op Pain Management:    Induction: Intravenous  PONV Risk Score and Plan: 3 and Ondansetron, Dexamethasone and Treatment may vary  due to age or medical condition  Airway Management Planned: Oral ETT  Additional Equipment: None  Intra-op Plan:   Post-operative Plan: Extubation in OR  Informed Consent: I have reviewed the patients History and Physical, chart, labs and discussed the procedure including the risks, benefits and alternatives for the proposed anesthesia with the patient or authorized representative who has indicated his/her understanding and acceptance.     Dental advisory given  Plan Discussed with: CRNA  Anesthesia Plan Comments:         Anesthesia Quick Evaluation

## 2020-06-14 NOTE — Op Note (Addendum)
   Operative Note   Date: 06/14/2020  Procedure: excision of right flank soft tissue mass  Pre-op diagnosis: right flank soft tissue mass Post-op diagnosis: same  Indication and clinical history: The patient is a 49 y.o. year old female with an enlarging right flank soft tissue mass     Surgeon: Jesusita Oka, MD  Anesthesiologist: Doroteo Glassman, MD Anesthesia: General  Findings:  . Specimen: right flank soft tissue mass, 15cmx10cm . EBL: <5cc . Drains/Implants: none  Disposition: PACU - hemodynamically stable.  Description of procedure: The patient was positioned supine on the operating room table. General anesthetic induction and intubation were uneventful. The patient was repositioned to right lateral decubitus and the right flank prepped and draped in the usual sterile fashion. Time-out was performed verifying correct patient, procedure, signature of informed consent, and administration of pre-operative antibiotics, VTE prophylaxis with low molecular weight heparin.   An elliptical incision was made overlying the mass and the mass circumferentially resected using electrocautery and excised. Meticulous hemostasis was achieved and the cavity irrigated with saline. The dead space was obliterated using 2-0 and 3-0 vicryl suture. The skin was re-approximated with 4-0 monocryl and sterile dressing applied using dermabond.   All sponge and instrument counts were correct at the conclusion of the procedure. The patient was awakened from anesthesia, extubated uneventfully, and transported to the PACU in good condition. There were no complications.    Jesusita Oka, MD General and [facility]

## 2020-06-15 ENCOUNTER — Encounter (HOSPITAL_COMMUNITY): Payer: Self-pay | Admitting: Surgery

## 2020-06-15 LAB — SURGICAL PATHOLOGY

## 2020-06-28 ENCOUNTER — Other Ambulatory Visit (HOSPITAL_COMMUNITY): Payer: Self-pay | Admitting: Allergy

## 2020-06-28 ENCOUNTER — Other Ambulatory Visit: Payer: Self-pay | Admitting: Adult Health

## 2020-06-28 MED FILL — MONTELUKAST SOD 10 MG TAB: 10 | 30 days supply | Qty: 30 | Fill #1

## 2020-06-28 MED FILL — LEVOCETIRIZINE 5 MG TABLET: 5 | 30 days supply | Qty: 30 | Fill #0

## 2020-06-28 MED FILL — GABAPENTIN 100 MG CAPSULE: 100 | 27 days supply | Qty: 180 | Fill #0

## 2020-07-04 MED FILL — AMLODIPINE BESYLATE 5 MG TA: 5 | 30 days supply | Qty: 30 | Fill #0

## 2020-07-12 MED FILL — AMLODIPINE BESYLATE 5 MG TA: 5 | 30 days supply | Qty: 30 | Fill #0

## 2020-08-27 IMAGING — DX DG KNEE COMPLETE 4+V*L*
4 series · 4 of 4 positions shown · non-contrast
Comparison: None.

CLINICAL DATA: Status post fall,

EXAM:
LEFT KNEE - COMPLETE 4+ VIEW

[knee ap]
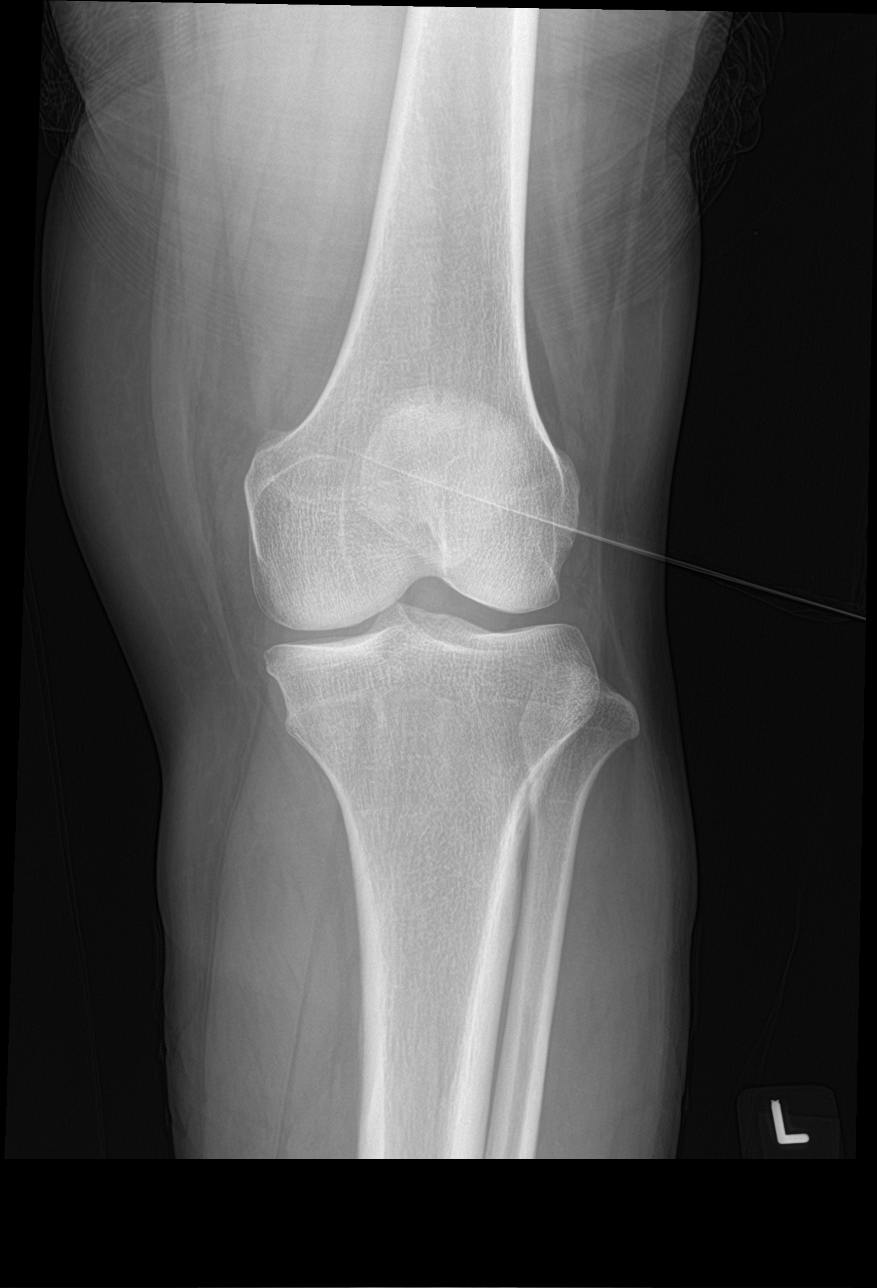

[knee lat]
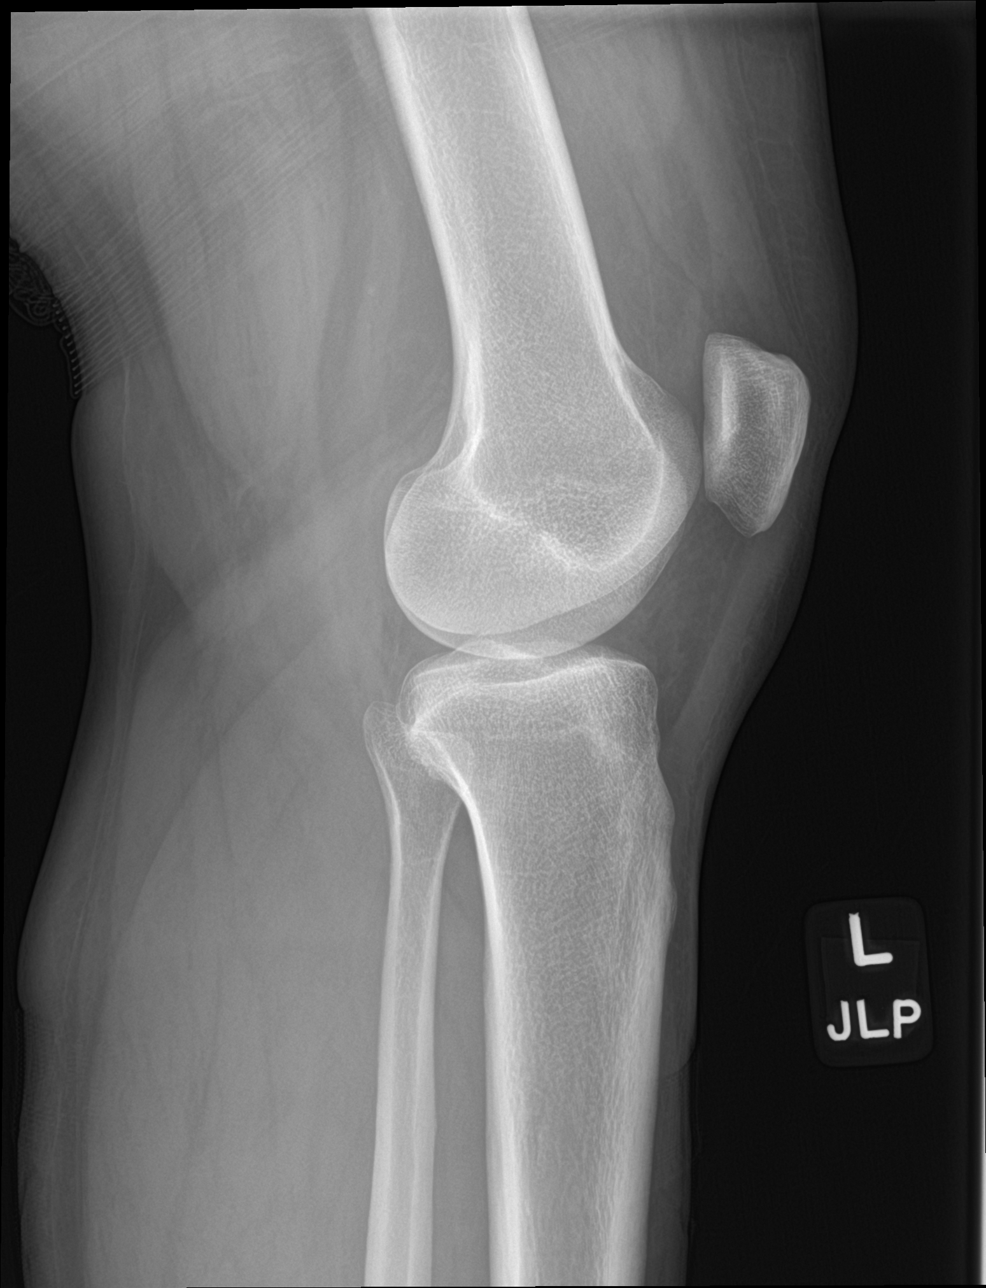

[knee obl (1 of 2)]
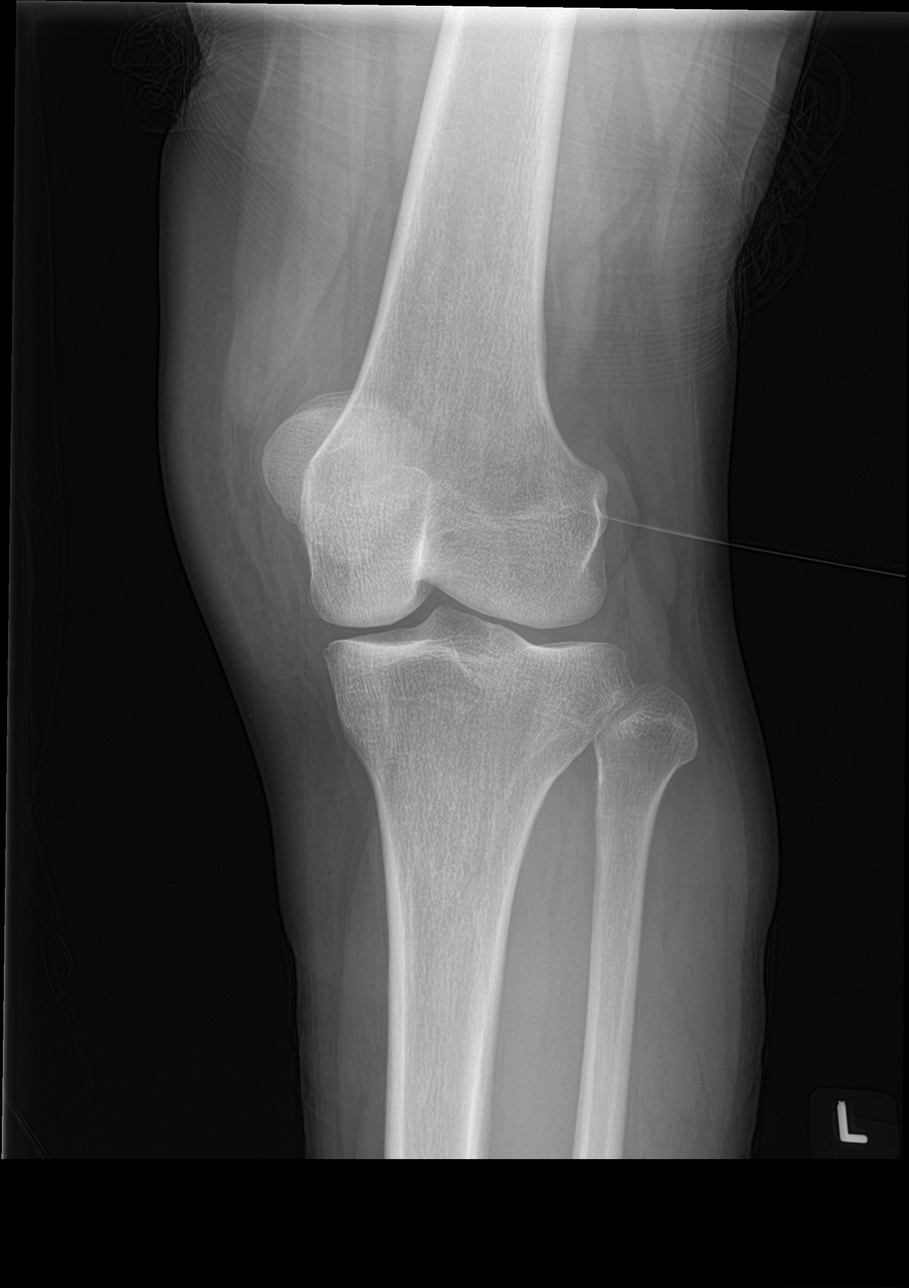

[knee obl (2 of 2)]
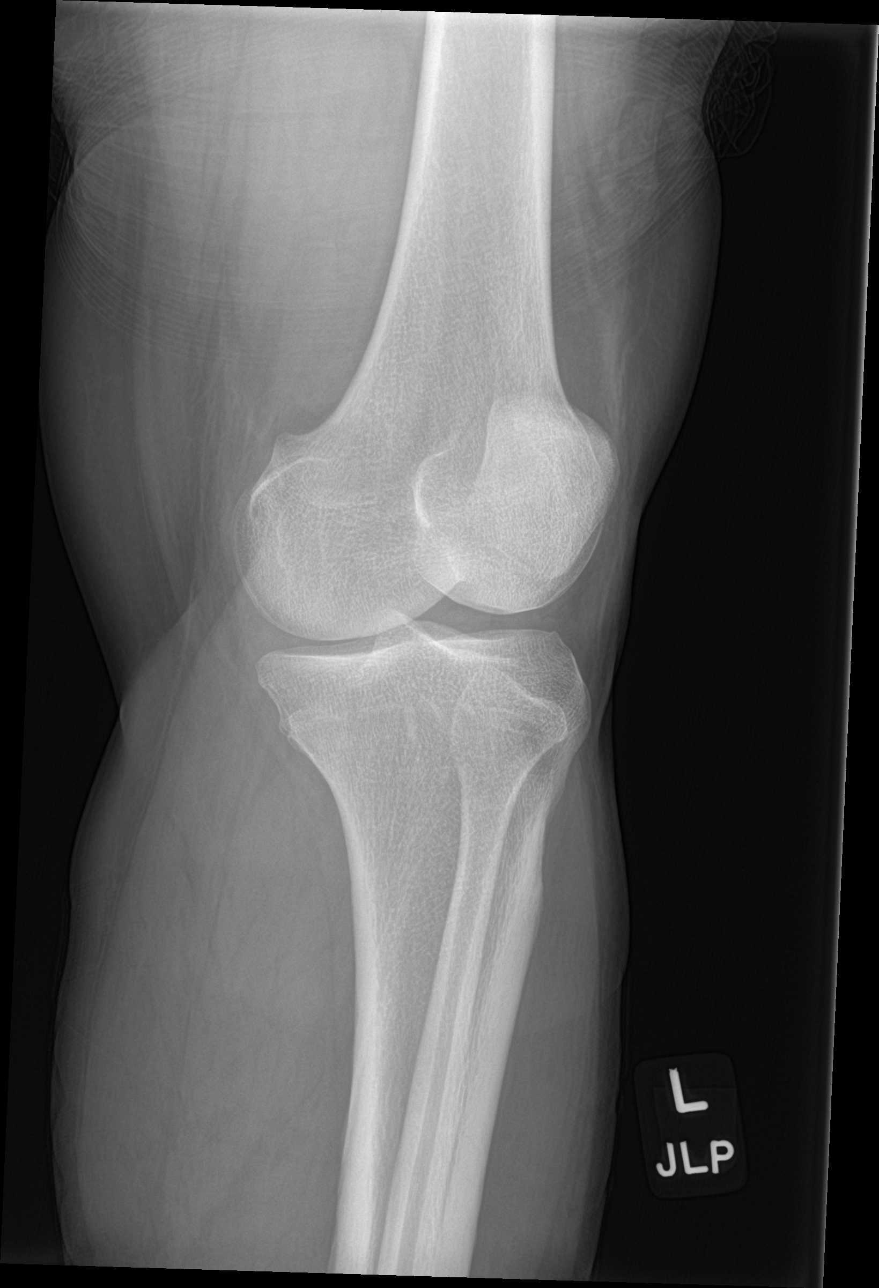

[4 of 4 positions shown; findings below may reference images not displayed]

FINDINGS: No evidence of fracture, dislocation, or joint effusion. Minimal
medial femorotibial compartment joint space narrowing. Soft tissues
are unremarkable.
IMPRESSION: No acute osseous injury of the left knee.

## 2020-08-29 ENCOUNTER — Other Ambulatory Visit: Payer: Self-pay | Admitting: Adult Health

## 2020-08-29 MED FILL — LEVOCETIRIZINE 5 MG TABLET: 5 | 30 days supply | Qty: 30 | Fill #1

## 2020-08-29 MED FILL — AMLODIPINE BESYLATE 5 MG TA: 5 | 30 days supply | Qty: 30 | Fill #0

## 2020-08-29 MED FILL — MONTELUKAST SOD 10 MG TAB: 10 | 30 days supply | Qty: 30 | Fill #2

## 2020-09-23 ENCOUNTER — Ambulatory Visit (INDEPENDENT_AMBULATORY_CARE_PROVIDER_SITE_OTHER): Payer: 59 | Admitting: Podiatry

## 2020-09-23 ENCOUNTER — Ambulatory Visit (INDEPENDENT_AMBULATORY_CARE_PROVIDER_SITE_OTHER): Payer: 59

## 2020-09-23 ENCOUNTER — Other Ambulatory Visit: Payer: Self-pay

## 2020-09-23 DIAGNOSIS — M722 Plantar fascial fibromatosis: Secondary | ICD-10-CM

## 2020-09-26 ENCOUNTER — Telehealth: Payer: Self-pay | Admitting: Podiatry

## 2020-09-26 NOTE — Telephone Encounter (Signed)
Pt states she's feeling pain from her foot to her calf from the injection she received on 12/17. Please advise.

## 2020-09-26 NOTE — Telephone Encounter (Signed)
Generally that is not common however, discuss with her to continue stretching for plantar fascitis and that can help. If her calf is red, hot, swollen and extremity painful then she is recommended to go to ER to rule out DVT/clot.

## 2020-09-27 ENCOUNTER — Encounter: Payer: Self-pay | Admitting: Podiatry

## 2020-09-27 NOTE — Progress Notes (Signed)
Subjective:  Patient ID: Stacy Berry, female    DOB: 12/22/1970,  MRN: [id number]  Chief Complaint  Patient presents with  . Foot Pain    Right foot     49 y.o. female presents with the above complaint.  Patient presents with complaint of right heel pain that is going into the heel and start transitioning to into the arch.  Patient states been going for 3 months is sharp shooting in nature.  She would like to discuss treatment options for this.  She has been walking for long periods of time.  She has not tried any treatment options.  She has not seen anyone else prior to seeing me.  She denies any other acute complaints.  She is a Equities trader.   Review of Systems: Negative except as noted in the HPI. Denies N/V/F/Ch.  Past Medical History:  Diagnosis Date  . Ectopic pregnancy   . Fibroid   . Hyperlipidemia   . Hypertension   . Infection    UTI  . Lipoma     Current Outpatient Medications:  .  albuterol (VENTOLIN HFA) 108 (90 Base) MCG/ACT inhaler, Inhale 1-2 puffs into the lungs every 4 (four) hours as needed for shortness of breath., Disp: , Rfl:  .  amLODipine (NORVASC) 5 MG tablet, TAKE 1 TABLET BY MOUTH DAILY. **DUE FOR YEARLY PHYSICAL**, Disp: 30 tablet, Rfl: 0 .  aspirin EC 81 MG tablet, Take 81 mg by mouth daily. Swallow whole., Disp: , Rfl:  .  azelastine (ASTELIN) 0.1 % nasal spray, Place 2 sprays into both nostrils 2 (two) times daily. Use in each nostril as directed, Disp: , Rfl:  .  azelastine (OPTIVAR) 0.05 % ophthalmic solution, Place 1 drop into both eyes 2 (two) times daily as needed for allergies., Disp: , Rfl:  .  Cyanocobalamin ([employer] B12 GUMMIES PO), Take 2 tablets by mouth daily., Disp: , Rfl:  .  ELDERBERRY PO, Take 0.5 tablets by mouth daily. , Disp: , Rfl:  .  ibuprofen (ADVIL) 800 MG tablet, Take 1 tablet (800 mg total) by mouth every 8 (eight) hours., Disp: 90 tablet, Rfl: 1 .  levocetirizine (XYZAL) 5 MG tablet, Take 5 mg by mouth every  evening. , Disp: , Rfl:  .  montelukast (SINGULAIR) 10 MG tablet, Take 10 mg by mouth at bedtime. , Disp: , Rfl:  .  oxyCODONE (ROXICODONE) 5 MG immediate release tablet, Take 1 tablet (5 mg total) by mouth every 8 (eight) hours as needed. Alternate each dose with tylenol/ibuprofen, Disp: 12 tablet, Rfl: 0  Social History   Tobacco Use  Smoking Status Former Smoker  . Packs/day: 0.01  . Years: 10.00  . Pack years: 0.10  Smokeless Tobacco Never Used    Allergies  Allergen Reactions  . Shellfish Allergy Anaphylaxis  . Cocoa Hives   Objective:  There were no vitals filed for this visit. There is no height or weight on file to calculate BMI. Constitutional Well developed. Well nourished.  Vascular Dorsalis pedis pulses palpable bilaterally. Posterior tibial pulses palpable bilaterally. Capillary refill normal to all digits.  No cyanosis or clubbing noted. Pedal hair growth normal.  Neurologic Normal speech. Oriented to person, place, and time. Epicritic sensation to light touch grossly present bilaterally.  Dermatologic Nails well groomed and normal in appearance. No open wounds. No skin lesions.  Orthopedic: Normal joint ROM without pain or crepitus bilaterally. No visible deformities. Tender to palpation at the calcaneal tuber right. No pain with  calcaneal squeeze right. Ankle ROM full range of motion right. Silfverskiold Test: negative right.   Radiographs: Taken and reviewed. No acute fractures or dislocations. No evidence of stress fracture.  Plantar heel spur absent. Posterior heel spur absent.   Assessment:   1. Plantar fasciitis of right foot    Plan:  Patient was evaluated and treated and all questions answered.  Plantar Fasciitis, right - XR reviewed as above.  - Educated on icing and stretching. Instructions given.  - Injection delivered to the plantar fascia as below. - DME: Plantar Fascial Brace - Pharmacologic management: None  Procedure: Injection  Tendon/Ligament Location: Right plantar fascia at the glabrous junction; medial approach. Skin Prep: alcohol Injectate: 0.5 cc 0.5% marcaine plain, 0.5 cc of 1% Lidocaine, 0.5 cc kenalog 10. Disposition: Patient tolerated procedure well. Injection site dressed with a band-aid.  No follow-ups on file.

## 2020-09-29 NOTE — Telephone Encounter (Signed)
Lvm for patient to call back and schedule an appointment for next week

## 2020-09-29 NOTE — Telephone Encounter (Signed)
Pt has called back and she stated that she has small bump under right heel, she has not been able to go to work due to her not being able to walk. Please call patient ASAP.

## 2020-09-29 NOTE — Telephone Encounter (Signed)
Have her come see me next week

## 2020-10-03 ENCOUNTER — Other Ambulatory Visit (HOSPITAL_COMMUNITY): Payer: Self-pay | Admitting: Physician Assistant

## 2020-10-03 ENCOUNTER — Other Ambulatory Visit: Payer: Self-pay | Admitting: Physician Assistant

## 2020-10-03 DIAGNOSIS — M542 Cervicalgia: Secondary | ICD-10-CM | POA: Insufficient documentation

## 2020-10-03 DIAGNOSIS — Z8639 Personal history of other endocrine, nutritional and metabolic disease: Secondary | ICD-10-CM | POA: Diagnosis not present

## 2020-10-03 DIAGNOSIS — E041 Nontoxic single thyroid nodule: Secondary | ICD-10-CM | POA: Insufficient documentation

## 2020-10-03 DIAGNOSIS — J3489 Other specified disorders of nose and nasal sinuses: Secondary | ICD-10-CM | POA: Diagnosis not present

## 2020-10-03 DIAGNOSIS — K219 Gastro-esophageal reflux disease without esophagitis: Secondary | ICD-10-CM | POA: Insufficient documentation

## 2020-10-03 DIAGNOSIS — F17200 Nicotine dependence, unspecified, uncomplicated: Secondary | ICD-10-CM | POA: Diagnosis not present

## 2020-10-03 DIAGNOSIS — R070 Pain in throat: Secondary | ICD-10-CM | POA: Insufficient documentation

## 2020-10-03 HISTORY — DX: Cervicalgia: M54.2

## 2020-10-03 HISTORY — DX: Nontoxic single thyroid nodule: E04.1

## 2020-10-03 HISTORY — DX: Gastro-esophageal reflux disease without esophagitis: K21.9

## 2020-10-03 HISTORY — DX: Pain in throat: R07.0

## 2020-10-03 MED FILL — OMEPRAZOLE 40 MG CPDR: 40 | 30 days supply | Qty: 30 | Fill #0

## 2020-10-04 ENCOUNTER — Other Ambulatory Visit (HOSPITAL_COMMUNITY): Payer: Self-pay | Admitting: Allergy

## 2020-10-04 MED FILL — ALBUTEROL SULFATE HFA 108 (: 108 (90 BAS | 16 days supply | Qty: 18 | Fill #0

## 2020-10-04 MED FILL — LEVOCETIRIZINE 5 MG TABLET: 5 | 30 days supply | Qty: 30 | Fill #2

## 2020-10-04 MED FILL — MONTELUKAST SOD 10 MG TAB: 10 | 30 days supply | Qty: 30 | Fill #3

## 2020-10-05 ENCOUNTER — Other Ambulatory Visit: Payer: Self-pay

## 2020-10-05 ENCOUNTER — Encounter (HOSPITAL_COMMUNITY): Payer: Self-pay

## 2020-10-05 ENCOUNTER — Ambulatory Visit (HOSPITAL_COMMUNITY)
Admission: RE | Admit: 2020-10-05 | Discharge: 2020-10-05 | Disposition: A | Discharge status: Home / Self Care | Payer: 59 | Source: Ambulatory Visit | Attending: Physician Assistant | Admitting: Physician Assistant

## 2020-10-05 DIAGNOSIS — E042 Nontoxic multinodular goiter: Secondary | ICD-10-CM | POA: Diagnosis not present

## 2020-10-05 DIAGNOSIS — M542 Cervicalgia: Secondary | ICD-10-CM | POA: Diagnosis not present

## 2020-10-05 DIAGNOSIS — E041 Nontoxic single thyroid nodule: Secondary | ICD-10-CM | POA: Insufficient documentation

## 2020-10-05 DIAGNOSIS — R070 Pain in throat: Secondary | ICD-10-CM | POA: Insufficient documentation

## 2020-10-05 LAB — POCT I-STAT CREATININE: Creatinine, Ser: 0.7 mg/dL (ref 0.44–1.00)

## 2020-10-05 MED ORDER — IOHEXOL 300 MG/ML  SOLN
75.0000 mL | Freq: Once | INTRAMUSCULAR | Status: AC | PRN
  Administered 2020-10-05: 75 mL via INTRAVENOUS

## 2020-10-10 DIAGNOSIS — U071 COVID-19: Secondary | ICD-10-CM | POA: Diagnosis not present

## 2020-10-10 DIAGNOSIS — R0789 Other chest pain: Secondary | ICD-10-CM | POA: Diagnosis not present

## 2020-10-10 DIAGNOSIS — R112 Nausea with vomiting, unspecified: Secondary | ICD-10-CM | POA: Diagnosis not present

## 2020-10-10 DIAGNOSIS — R197 Diarrhea, unspecified: Secondary | ICD-10-CM | POA: Diagnosis not present

## 2020-10-10 DIAGNOSIS — R0689 Other abnormalities of breathing: Secondary | ICD-10-CM | POA: Diagnosis not present

## 2020-10-10 DIAGNOSIS — R079 Chest pain, unspecified: Secondary | ICD-10-CM | POA: Diagnosis not present

## 2020-10-10 DIAGNOSIS — I10 Essential (primary) hypertension: Secondary | ICD-10-CM | POA: Diagnosis not present

## 2020-10-11 IMAGING — CT CT ANGIO CHEST
2 of 6 series · 19 of 36 positions shown · IV contrast (omnipaque)
Comparison: Thyroid ultrasound 01/06/2020

CLINICAL DATA: Shortness of breath, chest pain

EXAM:
CT ANGIOGRAPHY CHEST WITH CONTRAST
TECHNIQUE: Multidetector CT imaging of the chest was performed using the
standard protocol during bolus administration of intravenous
contrast. Multiplanar CT image reconstructions and MIPs were
obtained to evaluate the vascular anatomy.
CONTRAST:  75mL OMNIPAQUE IOHEXOL 350 MG/ML SOLN

[Series 8: pe thins · axial · 0.90mm/px · z∈[-308,-89]mm · 18 of 349 slices shown]
[im 18/349  lung]
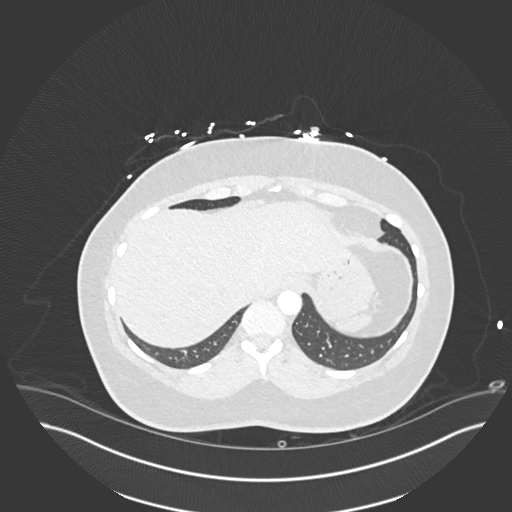
[im 35/349  mediastinal]
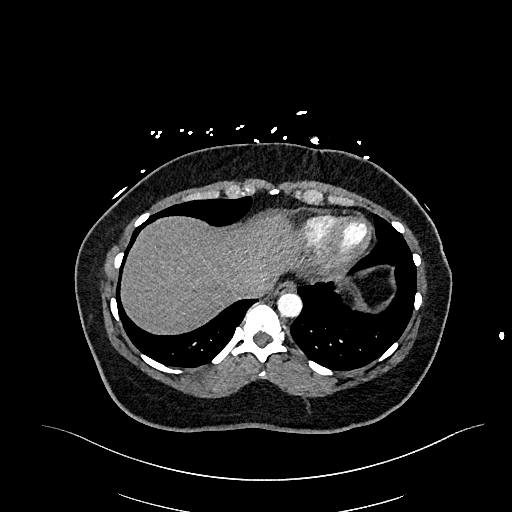
[im 53/349  lung]
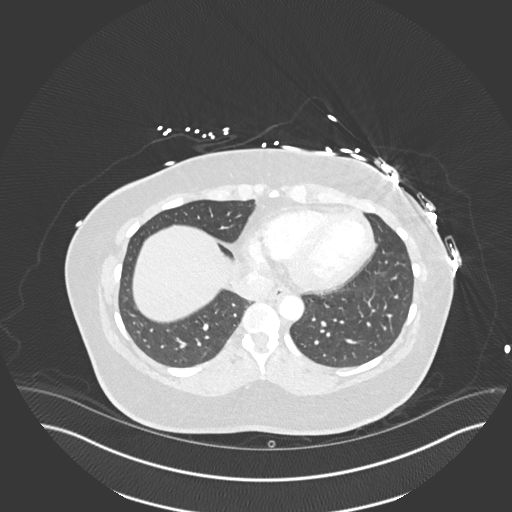
[im 70/349  mediastinal]
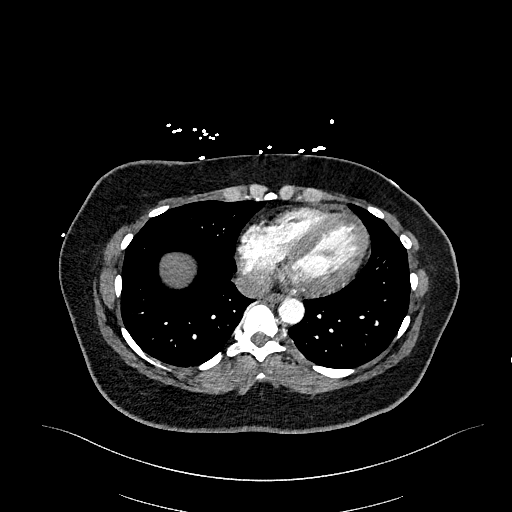
[im 88/349  lung]
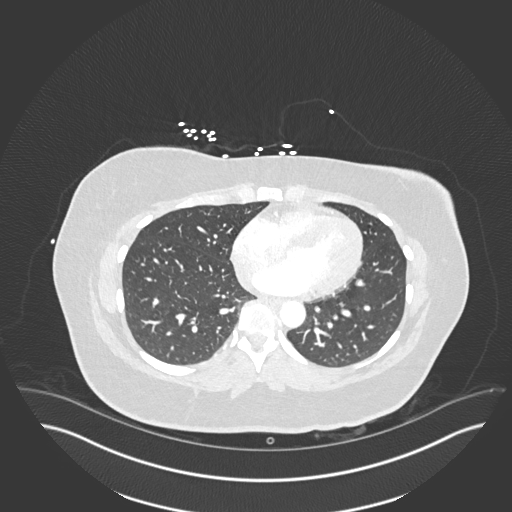
[im 105/349  mediastinal]
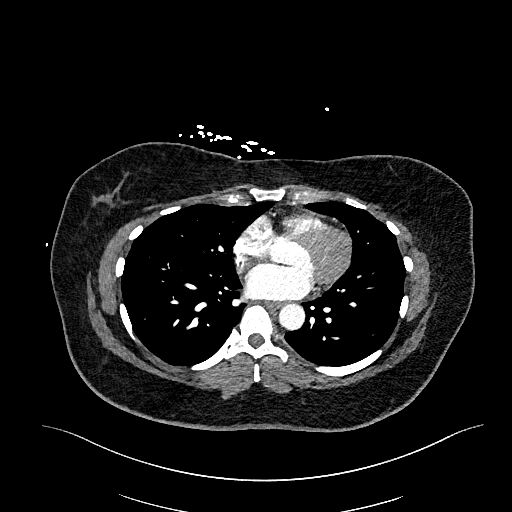
[im 122/349  lung]
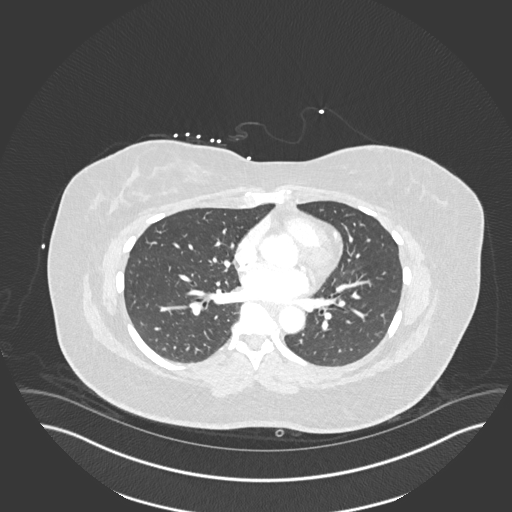
[im 140/349  mediastinal]
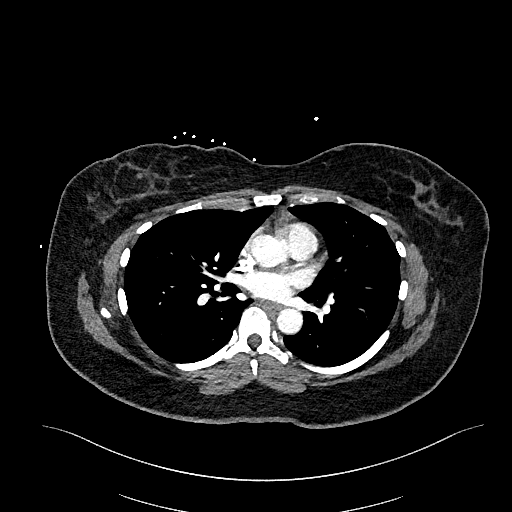
[im 157/349  lung]
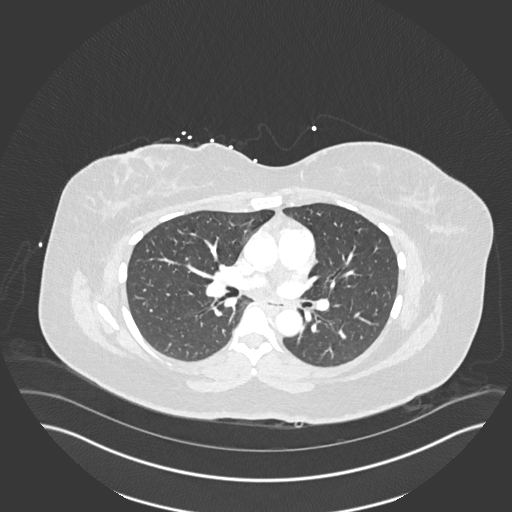
[im 192/349  mediastinal]
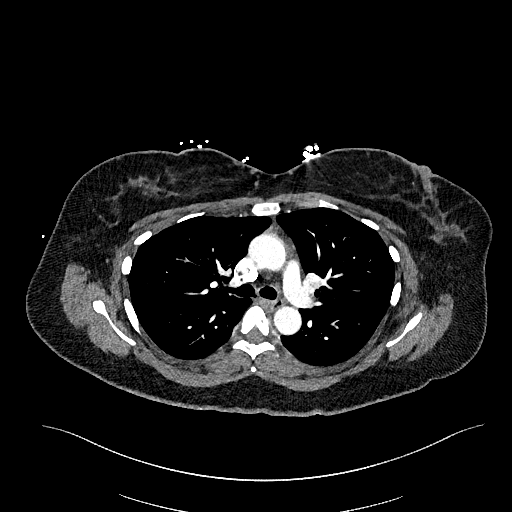
[im 209/349  lung]
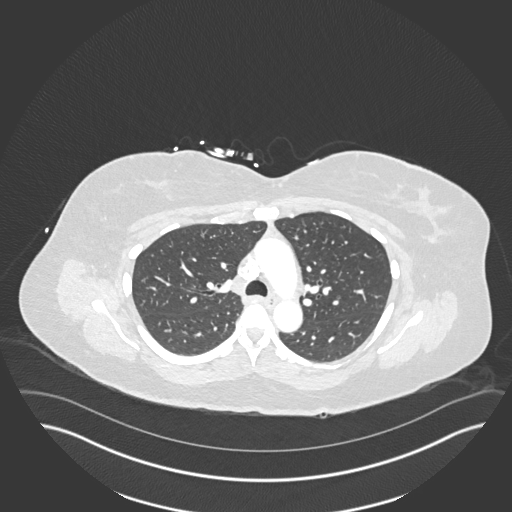
[im 227/349  mediastinal]
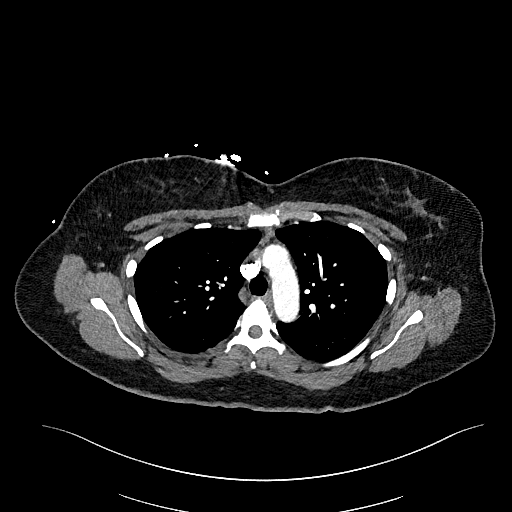
[im 244/349  lung]
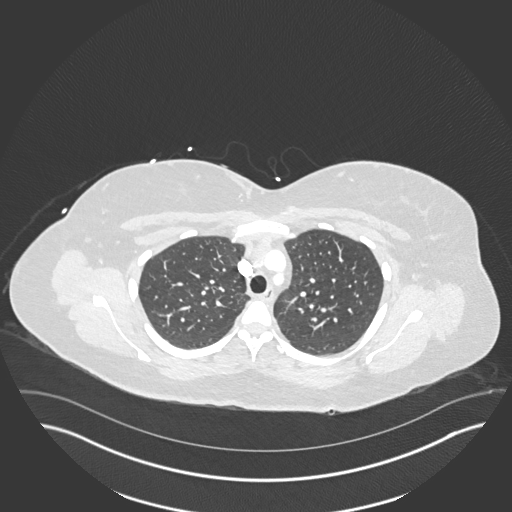
[im 262/349  mediastinal]
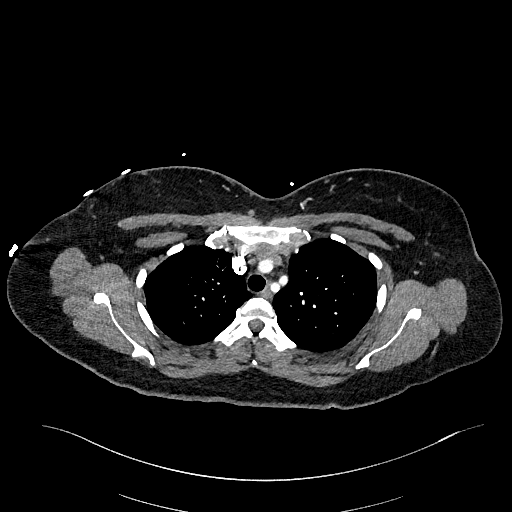
[im 279/349  lung]
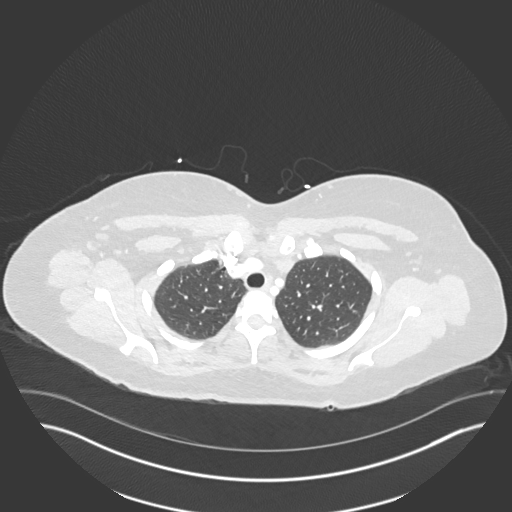
[im 296/349  mediastinal]
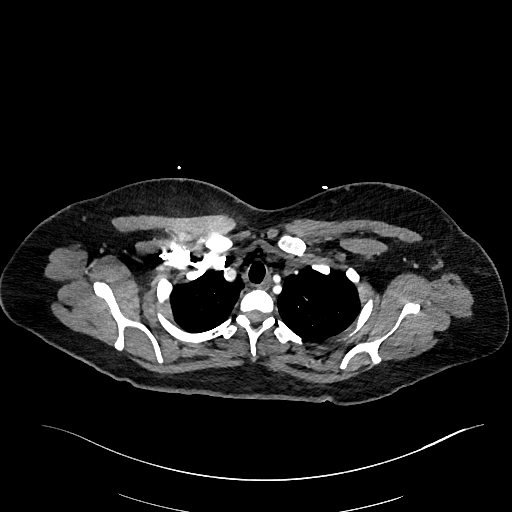
[im 314/349  lung]
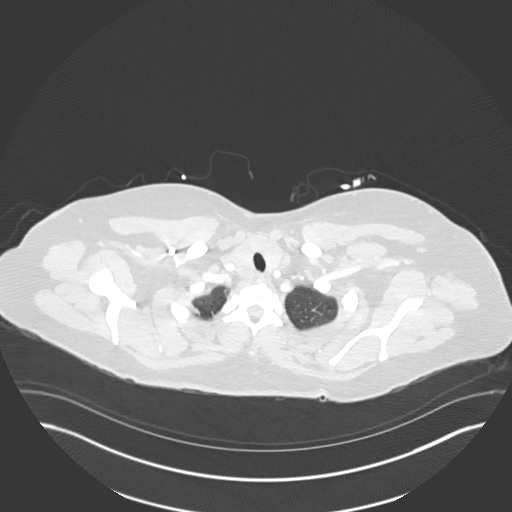
[im 331/349  mediastinal]
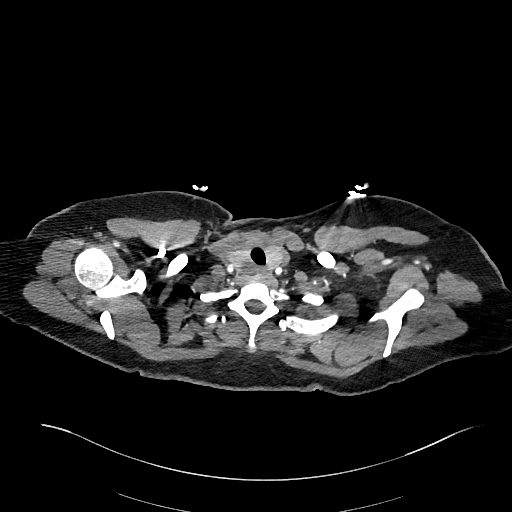

[Series 9: pe 2mm cor · coronal · 0.50mm/px · 1 of 145 slices shown]
[im 73/145  mediastinal]
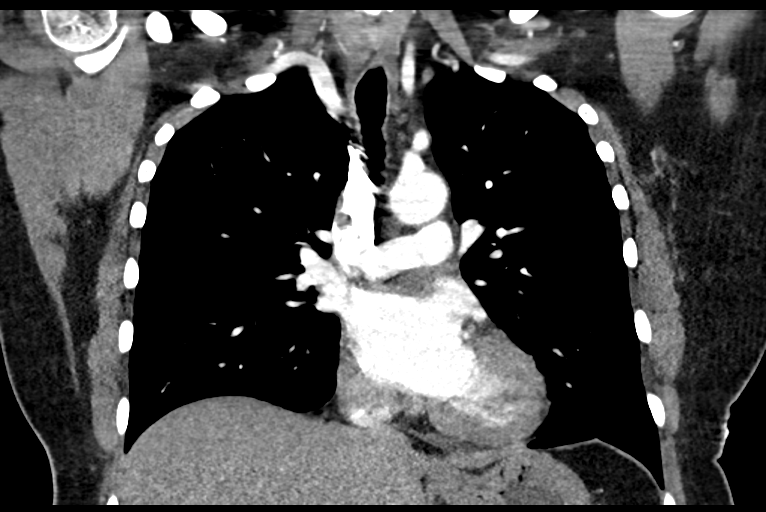

[19 of 36 positions shown; findings below may reference images not displayed]

FINDINGS: Cardiovascular: No filling defects in the pulmonary arteries to
suggest pulmonary emboli. Heart is normal size. Aorta is normal
caliber.

Mediastinum/Nodes: No mediastinal, hilar, or axillary adenopathy.
Trachea and esophagus are unremarkable. 1.4 cm nodule in the right
thyroid lobe. This has been documented on prior thyroid studies
(ref: [HOSPITAL]. [DATE]): 143-50).

Lungs/Pleura: Lungs are clear. No focal airspace opacities or
suspicious nodules. No effusions.

Upper Abdomen: Imaging into the upper abdomen shows no acute
findings.

Musculoskeletal: 2 Chest wall soft tissues are unremarkable. No
acute bony abnormality.

Review of the MIP images confirms the above findings.
IMPRESSION: No evidence of pulmonary embolus.

No acute cardiopulmonary disease.

## 2020-10-28 ENCOUNTER — Ambulatory Visit: Payer: 59 | Admitting: Podiatry

## 2020-10-31 ENCOUNTER — Telehealth: Payer: Self-pay | Admitting: Adult Health

## 2020-10-31 NOTE — Telephone Encounter (Signed)
Patient states she needs a refill on amlodipine.  Pharmacy: [facility]  Please advise

## 2020-11-01 ENCOUNTER — Other Ambulatory Visit: Payer: Self-pay | Admitting: Adult Health

## 2020-11-01 MED ORDER — AMLODIPINE BESYLATE 5 MG PO TABS: ORAL_TABLET | ORAL | 1 refills | Status: DC

## 2020-11-01 MED FILL — AMLODIPINE BESYLATE 5 MG TA: 5 | 30 days supply | Qty: 30 | Fill #0

## 2020-11-01 NOTE — Telephone Encounter (Signed)
Spoke to the pt and scheduled her for cpx.  Last was 04/2019.  Rx sent to the pharmacy for #30 with 1 refill.  Nothing further needed.

## 2020-11-14 ENCOUNTER — Other Ambulatory Visit (HOSPITAL_COMMUNITY): Payer: Self-pay | Admitting: Allergy

## 2020-11-14 MED FILL — LEVOCETIRIZINE 5 MG TABLET: 5 | 30 days supply | Qty: 30 | Fill #3

## 2020-11-14 MED FILL — MONTELUKAST SOD 10 MG TAB: 10 | 30 days supply | Qty: 30 | Fill #0

## 2020-12-05 MED FILL — AMLODIPINE BESYLATE 5 MG TA: 5 | 30 days supply | Qty: 30 | Fill #1

## 2020-12-08 ENCOUNTER — Encounter: Payer: Self-pay | Admitting: Adult Health

## 2020-12-08 ENCOUNTER — Ambulatory Visit (INDEPENDENT_AMBULATORY_CARE_PROVIDER_SITE_OTHER): Payer: 59 | Admitting: Adult Health

## 2020-12-08 ENCOUNTER — Other Ambulatory Visit: Payer: Self-pay | Admitting: Adult Health

## 2020-12-08 ENCOUNTER — Other Ambulatory Visit: Payer: Self-pay

## 2020-12-08 VITALS — BP 150/100 | HR 86 | Temp 98.4°F | Ht 65.0 in | Wt 200.1 lb

## 2020-12-08 DIAGNOSIS — Z0000 Encounter for general adult medical examination without abnormal findings: Secondary | ICD-10-CM | POA: Diagnosis not present

## 2020-12-08 DIAGNOSIS — E785 Hyperlipidemia, unspecified: Secondary | ICD-10-CM | POA: Diagnosis not present

## 2020-12-08 DIAGNOSIS — I10 Essential (primary) hypertension: Secondary | ICD-10-CM

## 2020-12-08 LAB — CBC WITH DIFFERENTIAL/PLATELET
Basophils Absolute: 0 10*3/uL (ref 0.0–0.1)
Basophils Relative: 0.6 % (ref 0.0–3.0)
Eosinophils Absolute: 0.1 10*3/uL (ref 0.0–0.7)
Eosinophils Relative: 1.4 % (ref 0.0–5.0)
HCT: 40.1 % (ref 36.0–46.0)
Hemoglobin: 13.5 g/dL (ref 12.0–15.0)
Lymphocytes Relative: 35.6 % (ref 12.0–46.0)
Lymphs Abs: 3 10*3/uL (ref 0.7–4.0)
MCHC: 33.7 g/dL (ref 30.0–36.0)
MCV: 95 fl (ref 78.0–100.0)
Monocytes Absolute: 0.7 10*3/uL (ref 0.1–1.0)
Monocytes Relative: 8.9 % (ref 3.0–12.0)
Neutro Abs: 4.5 10*3/uL (ref 1.4–7.7)
Neutrophils Relative %: 53.5 % (ref 43.0–77.0)
Platelets: 363 10*3/uL (ref 150.0–400.0)
RBC: 4.22 Mil/uL (ref 3.87–5.11)
RDW: 14.2 % (ref 11.5–15.5)
WBC: 8.3 10*3/uL (ref 4.0–10.5)

## 2020-12-08 LAB — COMPREHENSIVE METABOLIC PANEL
ALT: 11 U/L (ref 0–35)
AST: 12 U/L (ref 0–37)
Albumin: 4.2 g/dL (ref 3.5–5.2)
Alkaline Phosphatase: 94 U/L (ref 39–117)
BUN: 9 mg/dL (ref 6–23)
CO2: 29 mEq/L (ref 19–32)
Calcium: 9.8 mg/dL (ref 8.4–10.5)
Chloride: 103 mEq/L (ref 96–112)
Creatinine, Ser: 0.81 mg/dL (ref 0.40–1.20)
GFR: 84 mL/min (ref >60.00)
Glucose, Bld: 90 mg/dL (ref 70–99)
Potassium: 3.7 mEq/L (ref 3.5–5.1)
Sodium: 140 mEq/L (ref 135–145)
Total Bilirubin: 0.4 mg/dL (ref 0.2–1.2)
Total Protein: 6.7 g/dL (ref 6.0–8.3)

## 2020-12-08 LAB — LIPID PANEL
Cholesterol: 221 mg/dL — ABNORMAL HIGH (ref 0–200)
HDL: 53.7 mg/dL (ref >39.00)
LDL Cholesterol: 142 mg/dL — ABNORMAL HIGH (ref 0–99)
NonHDL: 167.14
Total CHOL/HDL Ratio: 4
Triglycerides: 126 mg/dL (ref 0.0–149.0)
VLDL: 25.2 mg/dL (ref 0.0–40.0)

## 2020-12-08 LAB — TSH: TSH: 0.9 u[IU]/mL (ref 0.35–4.50)

## 2020-12-08 MED ORDER — VALSARTAN-HYDROCHLOROTHIAZIDE 80-12.5 MG PO TABS: 1.0000 | ORAL_TABLET | Freq: Every day | ORAL | 0 refills | Status: DC

## 2020-12-08 MED FILL — VALSARTAN-HCTZ 80-12.5 MG T: 80-12.5 | 30 days supply | Qty: 30 | Fill #0

## 2020-12-08 NOTE — Progress Notes (Signed)
Subjective:    Patient ID: Stacy Berry, female    DOB: 1970/11/01, 50 y.o.   MRN: [id number]  HPI Patient presents for yearly preventative medicine examination. She is a pleasant 50 year old female who  has a past medical history of Ectopic pregnancy, Fibroid, Hyperlipidemia, Hypertension, Infection, and Lipoma.  Essential Hypertension -prescribed Norvasc 5 mg daily.  She has been on this medication for some time now, blood pressure is not controlled.  She would like to go back to taking Diovan which she was on at one point in time and this controlled her blood pressure very well. BP Readings from Last 3 Encounters:  12/08/20 (!) 150/100  06/14/20 (!) 145/104  06/08/20 (!) 181/104   Hyperlipidemia -prescribed simvastatin 10 mg at bedtime.  She stopped taking this sometime ago when she could not get it refilled.  Lab Results  Component Value Date   CHOL 265 (H) 05/19/2019   HDL 52.50 05/19/2019   LDLCALC 186 (H) 05/19/2019   TRIG 133.0 05/19/2019   CHOLHDL 5 05/19/2019    Seasonal Allergies -seen by allergy clinic.  Currently prescribed Xyzal 5 mg, Singulair 10 mg, Astelin nasal spray and Optivar ophthalmology solution  All immunizations and health maintenance protocols were reviewed with the patient and needed orders were placed.  Appropriate screening laboratory values were ordered for the patient including screening of hyperlipidemia, renal function and hepatic function.  Medication reconciliation,  past medical history, social history, problem list and allergies were reviewed in detail with the patient  Goals were established with regard to weight loss, exercise, and  diet in compliance with medications. She is actively working on weight loss. Wt Readings from Last 3 Encounters:  12/08/20 200 lb 1.6 oz (90.8 kg)  06/14/20 200 lb (90.7 kg)  06/08/20 204 lb 11.2 oz (92.9 kg)    She is overdue for routine colon cancer screening as well as mammogram.   She is seen by  GYN for routine care as well  Has no acute complaints .   Review of Systems  Constitutional: Negative.   HENT: Negative.   Eyes: Negative.   Respiratory: Negative.   Cardiovascular: Negative.   Gastrointestinal: Negative.   Endocrine: Negative.   Genitourinary: Negative.   Musculoskeletal: Negative.   Skin: Negative.   Allergic/Immunologic: Negative.   Neurological: Negative.   Hematological: Negative.   Psychiatric/Behavioral: Negative.    Past Medical History:  Diagnosis Date  . Ectopic pregnancy   . Fibroid   . Hyperlipidemia   . Hypertension   . Infection    UTI  . Lipoma     Social History   Socioeconomic History  . Marital status: Single    Spouse name: Not on file  . Number of children: Not on file  . Years of education: Not on file  . Highest education level: Not on file  Occupational History  . Not on file  Tobacco Use  . Smoking status: Former Smoker    Packs/day: 0.01    Years: 10.00    Pack years: 0.10  . Smokeless tobacco: Never Used  Vaping Use  . Vaping Use: Never used  Substance and Sexual Activity  . Alcohol use: Yes    Comment: occasional  . Drug use: No  . Sexual activity: Not Currently    Birth control/protection: Surgical    Comment: hysterectomy  Other Topics Concern  . Not on file  Social History Narrative   Wor   Social Determinants of Health  Financial Resource Strain: Not on file  Food Insecurity: Not on file  Transportation Needs: Not on file  Physical Activity: Not on file  Stress: Not on file  Social Connections: Not on file  Intimate Partner Violence: Not on file    Past Surgical History:  Procedure Laterality Date  . ECTOPIC PREGNANCY SURGERY  1990  . ENDOMETRIAL ABLATION    . GYNECOLOGIC CRYOSURGERY    . MASS EXCISION Right 06/14/2020   Procedure: EXCISION OF SOFT TISSUE MASS RIGHT FLANK;  Surgeon: Jesusita Oka, MD;  Location: [facility];  Service: General;  Laterality: Right;  . NSVD  1992. 1995. 1998. 2000   . ROBOTIC ASSISTED TOTAL HYSTERECTOMY Right 06/15/2013   Procedure: ROBOTIC ASSISTED TOTAL HYSTERECTOMY AND RIGHT SALINGECTOMY;  Surgeon: Lahoma Crocker, MD;  Location: [facility];  Service: Gynecology;  Laterality: Right;    Family History  Problem Relation Age of Onset  . Breast cancer Mother   . Prostate cancer Father   . Cancer Paternal Grandfather   . Breast cancer Maternal Grandmother   . Diabetes Maternal Grandfather   . Breast cancer Sister     Allergies  Allergen Reactions  . Shellfish Allergy Anaphylaxis  . Cocoa Hives    Current Outpatient Medications on File Prior to Visit  Medication Sig Dispense Refill  . albuterol (VENTOLIN HFA) 108 (90 Base) MCG/ACT inhaler Inhale 1-2 puffs into the lungs every 4 (four) hours as needed for shortness of breath.    Marland Kitchen amLODipine (NORVASC) 5 MG tablet TAKE 1 TABLET BY MOUTH DAILY. **DUE FOR YEARLY PHYSICAL** 30 tablet 1  . azelastine (ASTELIN) 0.1 % nasal spray Place 2 sprays into both nostrils 2 (two) times daily. Use in each nostril as directed    . azelastine (OPTIVAR) 0.05 % ophthalmic solution Place 1 drop into both eyes 2 (two) times daily as needed for allergies.    Marland Kitchen levocetirizine (XYZAL) 5 MG tablet Take 5 mg by mouth every evening.     . montelukast (SINGULAIR) 10 MG tablet Take 10 mg by mouth at bedtime.     . Prenatal Vit-Fe Fumarate-FA (PRENATAL MULTIVITAMIN) TABS tablet Take 1 tablet by mouth daily at 12 noon.    Marland Kitchen Specialty Vitamins Products (MAGNESIUM, AMINO ACID CHELATE,) 133 MG tablet Take 1 tablet by mouth 2 (two) times daily.    . [DISCONTINUED] simvastatin (ZOCOR) 10 MG tablet Take 1 tablet (10 mg total) by mouth at bedtime. (Patient not taking: Reported on 12/01/2019) 90 tablet 3   No current facility-administered medications on file prior to visit.    BP (!) 150/100 (BP Location: Left Arm, Patient Position: Sitting, Cuff Size: Large)   Pulse 86   Temp 98.4 F (36.9 C) (Oral)   Ht 5\' 5"  (1.651 m)   Wt 200 lb  1.6 oz (90.8 kg)   LMP 05/29/2013   SpO2 98%   BMI 33.30 kg/m       Objective:   Physical Exam Vitals and nursing note reviewed.  Constitutional:      General: She is not in acute distress.    Appearance: Normal appearance. She is well-developed. She is obese. She is not ill-appearing.  HENT:     Head: Normocephalic and atraumatic.     Right Ear: Tympanic membrane, ear canal and external ear normal. There is no impacted cerumen.     Left Ear: Tympanic membrane, ear canal and external ear normal. There is no impacted cerumen.     Nose: Nose normal. No congestion or  rhinorrhea.     Mouth/Throat:     Mouth: Mucous membranes are moist.     Pharynx: Oropharynx is clear. No oropharyngeal exudate or posterior oropharyngeal erythema.  Eyes:     General:        Right eye: No discharge.        Left eye: No discharge.     Extraocular Movements: Extraocular movements intact.     Conjunctiva/sclera: Conjunctivae normal.     Pupils: Pupils are equal, round, and reactive to light.  Neck:     Thyroid: No thyromegaly.     Vascular: No carotid bruit.     Trachea: No tracheal deviation.  Cardiovascular:     Rate and Rhythm: Normal rate and regular rhythm.     Pulses: Normal pulses.     Heart sounds: Normal heart sounds. No murmur heard. No friction rub. No gallop.   Pulmonary:     Effort: Pulmonary effort is normal. No respiratory distress.     Breath sounds: Normal breath sounds. No stridor. No wheezing, rhonchi or rales.  Chest:     Chest wall: No tenderness.  Abdominal:     General: Abdomen is flat. Bowel sounds are normal. There is no distension.     Palpations: Abdomen is soft. There is no mass.     Tenderness: There is no abdominal tenderness. There is no right CVA tenderness, left CVA tenderness, guarding or rebound.     Hernia: No hernia is present.  Musculoskeletal:        General: No swelling, tenderness, deformity or signs of injury. Normal range of motion.     Cervical  back: Normal range of motion and neck supple.     Right lower leg: No edema.     Left lower leg: No edema.  Lymphadenopathy:     Cervical: No cervical adenopathy.  Skin:    General: Skin is warm and dry.     Coloration: Skin is not jaundiced or pale.     Findings: No bruising, erythema, lesion or rash.  Neurological:     General: No focal deficit present.     Mental Status: She is alert and oriented to person, place, and time.     Cranial Nerves: No cranial nerve deficit.     Sensory: No sensory deficit.     Motor: No weakness.     Coordination: Coordination normal.     Gait: Gait normal.     Deep Tendon Reflexes: Reflexes normal.  Psychiatric:        Mood and Affect: Mood normal.        Behavior: Behavior normal.        Thought Content: Thought content normal.        Judgment: Judgment normal.       Assessment & Plan:  1. Routine general medical examination at a health care facility -Continue to work on lifestyle modifications. -Follow-up in 1 year for next CPE - CBC with Differential/Platelet; Future - Comprehensive metabolic panel; Future - Lipid panel; Future - TSH; Future - Ambulatory referral to Gastroenterology - MM Digital Screening; Future - TSH - Lipid panel - Comprehensive metabolic panel - CBC with Differential/Platelet  2. Essential hypertension D/c Norvasc and place back on Diovan  - Follow up in three weeks  - CBC with Differential/Platelet; Future - Comprehensive metabolic panel; Future - Lipid panel; Future - TSH; Future - TSH - Lipid panel - Comprehensive metabolic panel - CBC with Differential/Platelet - valsartan-hydrochlorothiazide (DIOVAN HCT) 80-12.5 MG tablet;  Take 1 tablet by mouth daily.  Dispense: 30 tablet; Refill: 0  3. Hyperlipidemia, unspecified hyperlipidemia type - Will likely need to be on statin medication  - CBC with Differential/Platelet; Future - Comprehensive metabolic panel; Future - Lipid panel; Future - TSH;  Future - TSH - Lipid panel - Comprehensive metabolic panel - CBC with Differential/Platelet  Dorothyann Peng, NP

## 2020-12-08 NOTE — Patient Instructions (Signed)
It was great seeing you today   I am going to change your blood pressure medication back to the combination medication.   Please follow up in 3 weeks   We will follow up with you regarding your blood work

## 2020-12-09 ENCOUNTER — Other Ambulatory Visit: Payer: Self-pay | Admitting: Adult Health

## 2020-12-09 MED ORDER — SIMVASTATIN 10 MG PO TABS: 10.0000 mg | ORAL_TABLET | Freq: Every day | ORAL | 1 refills | Status: DC

## 2020-12-09 MED FILL — SIMVASTATIN 10 MG TABLET: 10 | 90 days supply | Qty: 90 | Fill #0

## 2020-12-09 NOTE — Addendum Note (Signed)
Addended by: Agnes Lawrence on: 12/09/2020 08:35 AM   Modules accepted: Orders

## 2020-12-19 ENCOUNTER — Other Ambulatory Visit (HOSPITAL_COMMUNITY): Payer: Self-pay | Admitting: Allergy

## 2020-12-29 ENCOUNTER — Encounter: Payer: Self-pay | Admitting: Adult Health

## 2021-01-24 ENCOUNTER — Other Ambulatory Visit: Payer: Self-pay | Admitting: Adult Health

## 2021-01-24 ENCOUNTER — Other Ambulatory Visit (HOSPITAL_COMMUNITY): Payer: Self-pay

## 2021-01-24 DIAGNOSIS — I10 Essential (primary) hypertension: Secondary | ICD-10-CM

## 2021-01-24 MED ORDER — VALSARTAN-HYDROCHLOROTHIAZIDE 80-12.5 MG PO TABS
1.0000 | ORAL_TABLET | Freq: Every day | ORAL | 1 refills | Status: DC
  Filled 2021-01-24: qty 30, 30d supply, fill #0
  Filled 2021-03-07: qty 30, 30d supply, fill #1

## 2021-01-24 MED FILL — Montelukast Sodium Tab 10 MG (Base Equiv): ORAL | 30 days supply | Qty: 30 | Fill #0 | Status: AC

## 2021-01-24 MED FILL — Levocetirizine Dihydrochloride Tab 5 MG: ORAL | 30 days supply | Qty: 30 | Fill #0 | Status: AC

## 2021-03-07 ENCOUNTER — Other Ambulatory Visit: Payer: Self-pay

## 2021-03-07 ENCOUNTER — Telehealth (INDEPENDENT_AMBULATORY_CARE_PROVIDER_SITE_OTHER): Payer: 59 | Admitting: Family Medicine

## 2021-03-07 ENCOUNTER — Telehealth: Payer: 59 | Admitting: Adult Health

## 2021-03-07 ENCOUNTER — Other Ambulatory Visit (HOSPITAL_COMMUNITY): Payer: Self-pay

## 2021-03-07 DIAGNOSIS — J019 Acute sinusitis, unspecified: Secondary | ICD-10-CM | POA: Diagnosis not present

## 2021-03-07 MED ORDER — MONTELUKAST SODIUM 10 MG PO TABS
10.0000 mg | ORAL_TABLET | Freq: Every day | ORAL | 0 refills | Status: DC
  Filled 2021-03-07 – 2022-01-18 (×3): qty 30, 30d supply, fill #0

## 2021-03-07 MED ORDER — AMOXICILLIN-POT CLAVULANATE 875-125 MG PO TABS
1.0000 | ORAL_TABLET | Freq: Two times a day (BID) | ORAL | 0 refills | Status: DC
  Filled 2021-03-07: qty 14, 7d supply, fill #0

## 2021-03-07 MED FILL — Levocetirizine Dihydrochloride Tab 5 MG: ORAL | 30 days supply | Qty: 30 | Fill #1 | Status: AC

## 2021-03-07 MED FILL — Montelukast Sodium Tab 10 MG (Base Equiv): ORAL | 30 days supply | Qty: 30 | Fill #1 | Status: CN

## 2021-03-07 MED FILL — Montelukast Sodium Tab 10 MG (Base Equiv): ORAL | 90 days supply | Qty: 90 | Fill #1 | Status: AC

## 2021-03-07 NOTE — Progress Notes (Signed)
Patient ID: Stacy Berry, female   DOB: [birth year], 50 y.o.   MRN: [id number]  This visit type was conducted due to national recommendations for restrictions regarding the COVID-19 pandemic in an effort to limit this patient's exposure and mitigate transmission in our community.   Virtual Visit via Video Note  I connected with Danley Danker on 03/07/21 at 11:15 AM EDT by a video enabled telemedicine application and verified that I am speaking with the correct person using two identifiers.  Location patient: home Location provider:work or home office Persons participating in the virtual visit: patient, provider  I discussed the limitations of evaluation and management by telemedicine and the availability of in person appointments. The patient expressed understanding and agreed to proceed.   HPI:  Stacy Berry called with concerns for "sinus infection".  She has seasonal allergies and takes Xyzal and Singulair regularly.  However, she was out on the [facility] recently and not taking her medications when she returns she has had increased sinus congestive symptoms.  She is concerned about infection because some recent greenish nasal discharge and progressive daily headaches and facial pain.  She has had some occasional conjunctivitis type symptoms.  Increased malaise.  No fever.  She has had a progressive headache and facial pain for almost 2 weeks  ROS: See pertinent positives and negatives per HPI.  Past Medical History:  Diagnosis Date  . Ectopic pregnancy   . Fibroid   . Hyperlipidemia   . Hypertension   . Infection    UTI  . Lipoma     Past Surgical History:  Procedure Laterality Date  . ECTOPIC PREGNANCY SURGERY  1990  . ENDOMETRIAL ABLATION    . GYNECOLOGIC CRYOSURGERY    . MASS EXCISION Right 06/14/2020   Procedure: EXCISION OF SOFT TISSUE MASS RIGHT FLANK;  Surgeon: Jesusita Oka, MD;  Location: [facility];  Service: General;  Laterality: Right;  . NSVD  1992. 1995. 1998. 2000   . ROBOTIC ASSISTED TOTAL HYSTERECTOMY Right 06/15/2013   Procedure: ROBOTIC ASSISTED TOTAL HYSTERECTOMY AND RIGHT SALINGECTOMY;  Surgeon: Lahoma Crocker, MD;  Location: [facility];  Service: Gynecology;  Laterality: Right;    Family History  Problem Relation Age of Onset  . Breast cancer Mother   . Prostate cancer Father   . Cancer Paternal Grandfather   . Breast cancer Maternal Grandmother   . Diabetes Maternal Grandfather   . Breast cancer Sister     SOCIAL HX: Former smoker   Current Outpatient Medications:  .  amoxicillin-clavulanate (AUGMENTIN) 875-125 MG tablet, Take 1 tablet by mouth 2 (two) times daily., Disp: 14 tablet, Rfl: 0 .  albuterol (VENTOLIN HFA) 108 (90 Base) MCG/ACT inhaler, INHALE 1 TO 2 PUFFS EVERY 4 TO 6 HOURS AS NEEDED FOR COUGH/WHEEZING, Disp: 18 g, Rfl: 0 .  azelastine (ASTELIN) 0.1 % nasal spray, Place 2 sprays into both nostrils 2 (two) times daily. Use in each nostril as directed, Disp: , Rfl:  .  azelastine (OPTIVAR) 0.05 % ophthalmic solution, Place 1 drop into both eyes 2 (two) times daily as needed for allergies., Disp: , Rfl:  .  levocetirizine (XYZAL) 5 MG tablet, TAKE 1 TABLET BY MOUTH ONCE DAILY, Disp: 30 tablet, Rfl: 4 .  montelukast (SINGULAIR) 10 MG tablet, TAKE 1 TABLET BY MOUTH ONCE A DAY, Disp: 30 tablet, Rfl: 3 .  omeprazole (PRILOSEC) 40 MG capsule, TAKE 1 CAPSULE BY MOUTH ONCE DAILY 30 MINUTES BEFORE EVENING MEAL., Disp: 30 capsule, Rfl: 3 .  Prenatal  Vit-Fe Fumarate-FA (PRENATAL MULTIVITAMIN) TABS tablet, Take 1 tablet by mouth daily at 12 noon., Disp: , Rfl:  .  simvastatin (ZOCOR) 10 MG tablet, TAKE 1 TABLET (10 MG TOTAL) BY MOUTH AT BEDTIME., Disp: 90 tablet, Rfl: 1 .  Specialty Vitamins Products (MAGNESIUM, AMINO ACID CHELATE,) 133 MG tablet, Take 1 tablet by mouth 2 (two) times daily., Disp: , Rfl:  .  valsartan-hydrochlorothiazide (DIOVAN-HCT) 80-12.5 MG tablet, TAKE 1 TABLET BY MOUTH DAILY., Disp: 30 tablet, Rfl: 1  EXAM:  VITALS  per patient if applicable:  GENERAL: alert, oriented, appears well and in no acute distress  HEENT: atraumatic, conjunttiva clear, no obvious abnormalities on inspection of external nose and ears  NECK: normal movements of the head and neck  LUNGS: on inspection no signs of respiratory distress, breathing rate appears normal, no obvious gross SOB, gasping or wheezing  CV: no obvious cyanosis  MS: moves all visible extremities without noticeable abnormality  PSYCH/NEURO: pleasant and cooperative, no obvious depression or anxiety, speech and thought processing grossly intact  ASSESSMENT AND PLAN:  Discussed the following assessment and plan:  Acute sinusitis  -Given progression of symptoms will start Augmentin 875 mg twice daily for 7 days -Get back on her usual allergy medications -Follow-up for any persistent or worsening symptoms     I discussed the assessment and treatment plan with the patient. The patient was provided an opportunity to ask questions and all were answered. The patient agreed with the plan and demonstrated an understanding of the instructions.   The patient was advised to call back or seek an in-person evaluation if the symptoms worsen or if the condition fails to improve as anticipated.     Carolann Littler, MD

## 2021-03-13 ENCOUNTER — Other Ambulatory Visit (HOSPITAL_COMMUNITY): Payer: Self-pay

## 2021-03-13 ENCOUNTER — Ambulatory Visit (AMBULATORY_SURGERY_CENTER): Payer: Self-pay

## 2021-03-13 ENCOUNTER — Other Ambulatory Visit: Payer: Self-pay

## 2021-03-13 VITALS — Ht 65.0 in | Wt 209.0 lb

## 2021-03-13 DIAGNOSIS — Z1211 Encounter for screening for malignant neoplasm of colon: Secondary | ICD-10-CM

## 2021-03-13 MED ORDER — NA SULFATE-K SULFATE-MG SULF 17.5-3.13-1.6 GM/177ML PO SOLN
1.0000 | Freq: Once | ORAL | 0 refills | Status: AC
  Filled 2021-03-13: qty 354, 1d supply, fill #0

## 2021-03-13 NOTE — Progress Notes (Signed)
No allergies to soy or egg Pt is not on blood thinners or diet pills Denies issues with sedation/intubation Denies atrial flutter/fib Denies constipation   Emmi instructions given to pt  Pt is aware of Covid safety and care partner requirements.  

## 2021-03-16 ENCOUNTER — Other Ambulatory Visit (HOSPITAL_COMMUNITY): Payer: Self-pay

## 2021-03-27 ENCOUNTER — Other Ambulatory Visit: Payer: Self-pay

## 2021-03-27 ENCOUNTER — Ambulatory Visit (AMBULATORY_SURGERY_CENTER): Payer: 59 | Admitting: Gastroenterology

## 2021-03-27 ENCOUNTER — Encounter: Payer: Self-pay | Admitting: Gastroenterology

## 2021-03-27 VITALS — BP 118/62 | HR 75 | Temp 96.9°F | Resp 18 | Ht 65.0 in | Wt 209.0 lb

## 2021-03-27 DIAGNOSIS — D125 Benign neoplasm of sigmoid colon: Secondary | ICD-10-CM

## 2021-03-27 DIAGNOSIS — Z1211 Encounter for screening for malignant neoplasm of colon: Secondary | ICD-10-CM | POA: Diagnosis not present

## 2021-03-27 DIAGNOSIS — K635 Polyp of colon: Secondary | ICD-10-CM

## 2021-03-27 MED ORDER — SODIUM CHLORIDE 0.9 % IV SOLN: 500.0000 mL | Freq: Once | INTRAVENOUS | Status: AC

## 2021-03-27 NOTE — Progress Notes (Signed)
VS taken by [facility] 

## 2021-03-27 NOTE — Progress Notes (Signed)
Pt's states no medical or surgical changes since previsit or office visit. 

## 2021-03-27 NOTE — Op Note (Signed)
[facility] Patient Name: Stacy Berry Procedure Date: 03/27/2021 12:02 PM MRN: [id number] Endoscopist: Remo Lipps P. Havery [name] , MD Age: 50 Referring MD:  Date of Birth: 08/28/1971 Gender: Female Account #: [id number] Procedure:                Colonoscopy Indications:              Screening for colorectal malignant neoplasm, This                            is the patient's first colonoscopy Medicines:                Monitored Anesthesia Care Procedure:                Pre-Anesthesia Assessment:                           - Prior to the procedure, a History and Physical                            was performed, and patient medications and                            allergies were reviewed. The patient's tolerance of                            previous anesthesia was also reviewed. The risks                            and benefits of the procedure and the sedation                            options and risks were discussed with the patient.                            All questions were answered, and informed consent                            was obtained. Prior Anticoagulants: The patient has                            taken no previous anticoagulant or antiplatelet                            agents. ASA Grade Assessment: II - A patient with                            mild systemic disease. After reviewing the risks                            and benefits, the patient was deemed in                            satisfactory condition to undergo the procedure.  After obtaining informed consent, the colonoscope                            was passed under direct vision. Throughout the                            procedure, the patient's blood pressure, pulse, and                            oxygen saturations were monitored continuously. The                            Olympus [id number]-[id number] (#[id number]) Colonoscope was                            introduced through  the anus and advanced to the the                            cecum, identified by appendiceal orifice and                            ileocecal valve. The colonoscopy was performed                            without difficulty. The patient tolerated the                            procedure well. The quality of the bowel                            preparation was good. The ileocecal valve,                            appendiceal orifice, and rectum were photographed. Scope In: 12:08:25 PM Scope Out: 12:23:58 PM Scope Withdrawal Time: 0 hours 13 minutes 45 seconds  Total Procedure Duration: 0 hours 15 minutes 33 seconds  Findings:                 The perianal and digital rectal examinations were                            normal.                           Multiple small-mouthed diverticula were found in                            the left colon and right colon.                           A 7 mm polyp was found in the sigmoid colon. The                            polyp was flat. The polyp was removed with a cold  snare. Resection and retrieval were complete.                           The exam was otherwise without abnormality. Complications:            No immediate complications. Estimated blood loss:                            Minimal. Estimated Blood Loss:     Estimated blood loss was minimal. Impression:               - Diverticulosis in the left colon and in the right                            colon.                           - One 7 mm polyp in the sigmoid colon, removed with                            a cold snare. Resected and retrieved.                           - The examination was otherwise normal. Recommendation:           - Patient has a contact number available for                            emergencies. The signs and symptoms of potential                            delayed complications were discussed with the                            patient. Return  to normal activities tomorrow.                            Written discharge instructions were provided to the                            patient.                           - Resume previous diet.                           - Continue present medications.                           - Await pathology results. Remo Lipps P. Havery [name], MD 03/27/2021 12:28:16 PM This report has been signed electronically.

## 2021-03-27 NOTE — Progress Notes (Signed)
A/ox3, pleased with MAC, report to RN 

## 2021-03-27 NOTE — Progress Notes (Signed)
Called to room to assist during endoscopic procedure.  Patient ID and intended procedure confirmed with present staff. Received instructions for my participation in the procedure from the performing physician.  

## 2021-03-27 NOTE — Patient Instructions (Signed)
Impression/Recommendations:  Polyp and diverticulosis handouts given to patient.  Resume previous diet. Continue present medications. Await pathology results.  YOU HAD AN ENDOSCOPIC PROCEDURE TODAY AT THE [facility]:   Refer to the procedure report that was given to you for any specific questions about what was found during the examination.  If the procedure report does not answer your questions, please call your gastroenterologist to clarify.  If you requested that your care partner not be given the details of your procedure findings, then the procedure report has been included in a sealed envelope for you to review at your convenience later.  YOU SHOULD EXPECT: Some feelings of bloating in the abdomen. Passage of more gas than usual.  Walking can help get rid of the air that was put into your GI tract during the procedure and reduce the bloating. If you had a lower endoscopy (such as a colonoscopy or flexible sigmoidoscopy) you may notice spotting of blood in your stool or on the toilet paper. If you underwent a bowel prep for your procedure, you may not have a normal bowel movement for a few days.  Please Note:  You might notice some irritation and congestion in your nose or some drainage.  This is from the oxygen used during your procedure.  There is no need for concern and it should clear up in a day or so.  SYMPTOMS TO REPORT IMMEDIATELY:   Following lower endoscopy (colonoscopy or flexible sigmoidoscopy):  Excessive amounts of blood in the stool  Significant tenderness or worsening of abdominal pains  Swelling of the abdomen that is new, acute  Fever of 100F or higher  For urgent or emergent issues, a gastroenterologist can be reached at any hour by calling [phone]. Do not use MyChart messaging for urgent concerns.    DIET:  We do recommend a small meal at first, but then you may proceed to your regular diet.  Drink plenty of fluids but you should avoid  alcoholic beverages for 24 hours.  ACTIVITY:  You should plan to take it easy for the rest of today and you should NOT DRIVE or use heavy machinery until tomorrow (because of the sedation medicines used during the test).    FOLLOW UP: Our staff will call the number listed on your records 48-72 hours following your procedure to check on you and address any questions or concerns that you may have regarding the information given to you following your procedure. If we do not reach you, we will leave a message.  We will attempt to reach you two times.  During this call, we will ask if you have developed any symptoms of COVID 19. If you develop any symptoms (ie: fever, flu-like symptoms, shortness of breath, cough etc.) before then, please call [phone].  If you test positive for Covid 19 in the 2 weeks post procedure, please call and report this information to us.    If any biopsies were taken you will be contacted by phone or by letter within the next 1-3 weeks.  Please call us at [phone] if you have not heard about the biopsies in 3 weeks.    SIGNATURES/CONFIDENTIALITY: You and/or your care partner have signed paperwork which will be entered into your electronic medical record.  These signatures attest to the fact that that the information above on your After Visit Summary has been reviewed and is understood.  Full responsibility of the confidentiality of this discharge information lies with you and/or your   care-partner. 

## 2021-03-29 ENCOUNTER — Telehealth (HOSPITAL_BASED_OUTPATIENT_CLINIC_OR_DEPARTMENT_OTHER): Payer: Self-pay

## 2021-03-29 ENCOUNTER — Telehealth: Payer: Self-pay

## 2021-03-29 NOTE — Telephone Encounter (Signed)
  Follow up Call-  Call back number 03/27/2021  Post procedure Call Back phone  # [phone]  Permission to leave phone message Yes  Some recent data might be hidden     Patient questions:  Do you have a fever, pain , or abdominal swelling? No. Pain Score  0 *  Have you tolerated food without any problems? Yes.    Have you been able to return to your normal activities? Yes.    Do you have any questions about your discharge instructions: Diet   No. Medications  No. Follow up visit  No.  Do you have questions or concerns about your Care? No.  Actions: * If pain score is 4 or above: No action needed, pain <4.  Have you developed a fever since your procedure? no  2.   Have you had an respiratory symptoms (SOB or cough) since your procedure? no  3.   Have you tested positive for COVID 19 since your procedure no  4.   Have you had any family members/close contacts diagnosed with the COVID 19 since your procedure?  no   If yes to any of these questions please route to Joylene John, RN and Joella Prince, RN

## 2021-03-29 NOTE — Telephone Encounter (Signed)
Patient left message with front desk staff for someone to give her a call about her "creme". LMOM for patient to give our office a call back. tbw

## 2021-03-30 ENCOUNTER — Ambulatory Visit (HOSPITAL_BASED_OUTPATIENT_CLINIC_OR_DEPARTMENT_OTHER): Payer: 59

## 2021-03-30 ENCOUNTER — Encounter (HOSPITAL_BASED_OUTPATIENT_CLINIC_OR_DEPARTMENT_OTHER): Payer: Self-pay

## 2021-03-30 ENCOUNTER — Telehealth (HOSPITAL_BASED_OUTPATIENT_CLINIC_OR_DEPARTMENT_OTHER): Payer: Self-pay

## 2021-03-30 NOTE — Telephone Encounter (Signed)
Called patient this morning to inquire about her missed appointment. Patient informed us that she is trying the Monistat 7 over the counter and will keep her scheduled follow up with Dr. Sabra Heck. tbw

## 2021-04-04 ENCOUNTER — Other Ambulatory Visit: Payer: Self-pay | Admitting: Adult Health

## 2021-04-04 ENCOUNTER — Other Ambulatory Visit (HOSPITAL_COMMUNITY): Payer: Self-pay

## 2021-04-04 DIAGNOSIS — I10 Essential (primary) hypertension: Secondary | ICD-10-CM

## 2021-04-04 MED ORDER — VALSARTAN-HYDROCHLOROTHIAZIDE 80-12.5 MG PO TABS
1.0000 | ORAL_TABLET | Freq: Every day | ORAL | 1 refills | Status: DC
  Filled 2021-04-04: qty 30, 30d supply, fill #0
  Filled 2021-05-12: qty 30, 30d supply, fill #1

## 2021-04-04 MED FILL — Levocetirizine Dihydrochloride Tab 5 MG: ORAL | 60 days supply | Qty: 60 | Fill #2 | Status: AC

## 2021-04-06 ENCOUNTER — Other Ambulatory Visit (HOSPITAL_COMMUNITY): Payer: Self-pay

## 2021-04-06 MED ORDER — CARESTART COVID-19 HOME TEST VI KIT
PACK | 0 refills | Status: DC
  Filled 2021-04-06: qty 4, 4d supply, fill #0

## 2021-04-11 ENCOUNTER — Other Ambulatory Visit: Payer: Self-pay

## 2021-04-12 ENCOUNTER — Encounter: Payer: Self-pay | Admitting: Family Medicine

## 2021-04-12 ENCOUNTER — Ambulatory Visit: Payer: 59 | Admitting: Family Medicine

## 2021-04-12 ENCOUNTER — Other Ambulatory Visit (HOSPITAL_COMMUNITY): Payer: Self-pay

## 2021-04-12 VITALS — BP 130/76 | HR 89 | Temp 97.6°F | Wt 212.9 lb

## 2021-04-12 DIAGNOSIS — M5412 Radiculopathy, cervical region: Secondary | ICD-10-CM

## 2021-04-12 DIAGNOSIS — L309 Dermatitis, unspecified: Secondary | ICD-10-CM

## 2021-04-12 MED ORDER — TRIAMCINOLONE ACETONIDE 0.1 % EX CREA
1.0000 "application " | TOPICAL_CREAM | Freq: Two times a day (BID) | CUTANEOUS | 0 refills | Status: DC
  Filled 2021-04-12: qty 30, 15d supply, fill #0

## 2021-04-12 MED ORDER — PREDNISONE 10 MG PO TABS
ORAL_TABLET | ORAL | 0 refills | Status: DC
  Filled 2021-04-12: qty 24, 9d supply, fill #0

## 2021-04-12 NOTE — Progress Notes (Signed)
Established Patient Office Visit  Subjective:  Patient ID: Stacy Berry, female    DOB: May 05, 1971  Age: 50 y.o. MRN: [id number]  CC:  Chief Complaint  Patient presents with   Back Pain    Sharp pain in the upper back, radiates down the arm, x 3 days, pt states it doesn't hurt as bad today, no known injury    HPI Stacy Berry presents for the following issues  Occasional sharp pains radiating from the base of her right side of her neck all the way down to the hand at times.  She has had this pain for the past few days.  No known injury.  No numbness or weakness.  Previous imaging of the neck is shown some degenerative changes especially C5-6.  Right-hand-dominant.  She also on gabapentin at home from prior prescription but has not taken any.  Reviewed prior imaging from CT soft tissue neck with contrast from 12/29-2021  She also relates pruritic rash mostly lower extremities.  She had some patches which are slightly scaly.  Tried bacitracin ointment without improvement.  She seems to be getting some new patches periodically.  No clear triggers. No associated pain.  She was initially concerned this may be shingles.  Past Medical History:  Diagnosis Date   Allergy    seasonal allergies   Ectopic pregnancy    Fibroid    Hyperlipidemia    Hypertension    Infection    UTI   Lipoma     Past Surgical History:  Procedure Laterality Date   ECTOPIC PREGNANCY SURGERY  1990   ENDOMETRIAL ABLATION     GYNECOLOGIC CRYOSURGERY     MASS EXCISION Right 06/14/2020   Procedure: EXCISION OF SOFT TISSUE MASS RIGHT FLANK;  Surgeon: Jesusita Oka, MD;  Location: [place];  Service: General;  Laterality: Right;   [place] Right 06/15/2013   Procedure: ROBOTIC ASSISTED TOTAL HYSTERECTOMY AND RIGHT SALINGECTOMY;  Surgeon: Lahoma Crocker, MD;  Location: [facility];  Service: Gynecology;  Laterality: Right;    Family History  Problem  Relation Age of Onset   Breast cancer Mother    Prostate cancer Father    Cancer Paternal Grandfather    Breast cancer Maternal Grandmother    Diabetes Maternal Grandfather    Breast cancer Sister    Colon cancer Neg Hx    Colon polyps Neg Hx    Esophageal cancer Neg Hx    Rectal cancer Neg Hx    Stomach cancer Neg Hx     Social History   Socioeconomic History   Marital status: Single    Spouse name: Not on file   Number of children: Not on file   Years of education: Not on file   Highest education level: Not on file  Occupational History   Not on file  Tobacco Use   Smoking status: Former    Packs/day: 0.01    Years: 10.00    Pack years: 0.10    Types: Cigarettes   Smokeless tobacco: Never  Vaping Use   Vaping Use: Never used  Substance and Sexual Activity   Alcohol use: Yes    Comment: occasional   Drug use: No   Sexual activity: Not Currently    Birth control/protection: Surgical    Comment: hysterectomy  Other Topics Concern   Not on file  Social History Narrative   Wor   Social Determinants of Health  Financial Resource Strain: Not on file  Food Insecurity: Not on file  Transportation Needs: Not on file  Physical Activity: Not on file  Stress: Not on file  Social Connections: Not on file  Intimate Partner Violence: Not on file    Outpatient Medications Prior to Visit  Medication Sig Dispense Refill   albuterol (VENTOLIN HFA) 108 (90 Base) MCG/ACT inhaler INHALE 1 TO 2 PUFFS EVERY 4 TO 6 HOURS AS NEEDED FOR COUGH/WHEEZING 18 g 0   amoxicillin-clavulanate (AUGMENTIN) 875-125 MG tablet Take 1 tablet by mouth 2 (two) times daily. 14 tablet 0   azelastine (ASTELIN) 0.1 % nasal spray Place 2 sprays into both nostrils 2 (two) times daily. Use in each nostril as directed     azelastine (OPTIVAR) 0.05 % ophthalmic solution Place 1 drop into both eyes 2 (two) times daily as needed for allergies.     CALCIUM PO Take by mouth.     COVID-19 At Home Antigen Test  (CARESTART COVID-19 HOME TEST) KIT USE AS DIRECTED WITHIN PACKAGE INSTRUCTIONS. 4 each 0   Cyanocobalamin (B-12 PO) Take by mouth.     ELDERBERRY PO Take by mouth.     levocetirizine (XYZAL) 5 MG tablet TAKE 1 TABLET BY MOUTH ONCE DAILY 30 tablet 4   MAGNESIUM PO Take by mouth.     montelukast (SINGULAIR) 10 MG tablet TAKE 1 TABLET BY MOUTH ONCE DAILY EVERY EVENING 30 tablet 4   montelukast (SINGULAIR) 10 MG tablet TAKE 1 TABLET BY MOUTH ONCE A DAY 30 tablet 3   montelukast (SINGULAIR) 10 MG tablet Take 1 tablet (10 mg total) by mouth daily. 30 tablet 0   Multiple Vitamins-Minerals (ZINC PO) Take by mouth.     omeprazole (PRILOSEC) 40 MG capsule TAKE 1 CAPSULE BY MOUTH ONCE DAILY 30 MINUTES BEFORE EVENING MEAL. 30 capsule 3   Prenatal Vit-Fe Fumarate-FA (PRENATAL MULTIVITAMIN) TABS tablet Take 1 tablet by mouth daily at 12 noon.     simvastatin (ZOCOR) 10 MG tablet TAKE 1 TABLET (10 MG TOTAL) BY MOUTH AT BEDTIME. 90 tablet 1   Specialty Vitamins Products (MAGNESIUM, AMINO ACID CHELATE,) 133 MG tablet Take 1 tablet by mouth 2 (two) times daily.     valsartan-hydrochlorothiazide (DIOVAN-HCT) 80-12.5 MG tablet TAKE 1 TABLET BY MOUTH DAILY. 30 tablet 1   Facility-Administered Medications Prior to Visit  Medication Dose Route Frequency Provider Last Rate Last Admin   0.9 %  sodium chloride infusion  500 mL Intravenous Once Armbruster, Carlota Raspberry, MD        Allergies  Allergen Reactions   Shellfish Allergy Anaphylaxis   Cocoa Hives    ROS Review of Systems  Musculoskeletal:  Positive for neck pain.  Skin:  Positive for rash.  Neurological:  Negative for weakness and numbness.     Objective:    Physical Exam Vitals reviewed.  Constitutional:      Appearance: Normal appearance.  Cardiovascular:     Rate and Rhythm: Normal rate and regular rhythm.  Musculoskeletal:     Cervical back: Neck supple.  Skin:    Comments: She has a couple of patches of slightly scaly skin on her specially  right lower leg somewhat nummular.  No elevated border.  No clearing of the center.  No pustules.  No vesicles.  Neurological:     Mental Status: She is alert.     Comments: Reflexes are 2+ throughout upper extremities.  No weakness.    BP 130/76 (BP Location: Left Arm, Patient Position:  Sitting, Cuff Size: Normal)   Pulse 89   Temp 97.6 F (36.4 C) (Oral)   Wt 212 lb 14.4 oz (96.6 kg)   LMP 05/29/2013   SpO2 99%   BMI 35.43 kg/m  Wt Readings from Last 3 Encounters:  04/12/21 212 lb 14.4 oz (96.6 kg)  03/27/21 209 lb (94.8 kg)  03/13/21 209 lb (94.8 kg)     Health Maintenance Due  Topic Date Due   Pneumococcal Vaccine 41-73 Years old (1 - PCV) Never done   Hepatitis C Screening  Never done   Zoster Vaccines- Shingrix (1 of 2) Never done   COVID-19 Vaccine (3 - Pfizer risk series) 11/25/2019   MAMMOGRAM  01/11/2020    There are no preventive care reminders to display for this patient.  Lab Results  Component Value Date   TSH 0.90 12/08/2020   Lab Results  Component Value Date   WBC 8.3 12/08/2020   HGB 13.5 12/08/2020   HCT 40.1 12/08/2020   MCV 95.0 12/08/2020   PLT 363.0 12/08/2020   Lab Results  Component Value Date   NA 140 12/08/2020   K 3.7 12/08/2020   CO2 29 12/08/2020   GLUCOSE 90 12/08/2020   BUN 9 12/08/2020   CREATININE 0.81 12/08/2020   BILITOT 0.4 12/08/2020   ALKPHOS 94 12/08/2020   AST 12 12/08/2020   ALT 11 12/08/2020   PROT 6.7 12/08/2020   ALBUMIN 4.2 12/08/2020   CALCIUM 9.8 12/08/2020   ANIONGAP 11 06/08/2020   GFR 84.00 12/08/2020   Lab Results  Component Value Date   CHOL 221 (H) 12/08/2020   Lab Results  Component Value Date   HDL 53.70 12/08/2020   Lab Results  Component Value Date   LDLCALC 142 (H) 12/08/2020   Lab Results  Component Value Date   TRIG 126.0 12/08/2020   Lab Results  Component Value Date   CHOLHDL 4 12/08/2020   Lab Results  Component Value Date   HGBA1C 5.7 05/19/2019      Assessment &  Plan:   #1 eczematous rash on her extremities.  Question nummular eczema.  -Leave off bacitracin -Consider triamcinolone 0.1% cream twice daily as needed  #2 right cervical radiculitis symptoms.  Nonfocal neuro exam.  History of known degenerative changes on previous imaging  -Consider trial of prednisone taper -Watch out for any numbness or weakness or any progressive or persistent pain -She wishes to avoid additional pain medications unless symptoms worsen. -Touch base if symptoms not improving over the next couple weeks and sooner for any progressive symptoms  Meds ordered this encounter  Medications   triamcinolone cream (KENALOG) 0.1 %    Sig: Apply 1 application topically 2 (two) times daily.    Dispense:  30 g    Refill:  0   predniSONE (DELTASONE) 10 MG tablet    Sig: Taper as follows: 4-4-4-3-3-2-2-1-1    Dispense:  24 tablet    Refill:  0    Follow-up: No follow-ups on file.    Carolann Littler, MD

## 2021-04-26 ENCOUNTER — Ambulatory Visit (HOSPITAL_BASED_OUTPATIENT_CLINIC_OR_DEPARTMENT_OTHER): Payer: 59 | Admitting: Obstetrics & Gynecology

## 2021-05-08 ENCOUNTER — Ambulatory Visit: Payer: 59

## 2021-05-12 ENCOUNTER — Other Ambulatory Visit (HOSPITAL_COMMUNITY): Payer: Self-pay

## 2021-05-12 MED ORDER — CARESTART COVID-19 HOME TEST VI KIT
PACK | 0 refills | Status: DC
  Filled 2021-05-12: qty 4, 4d supply, fill #0

## 2021-05-15 ENCOUNTER — Other Ambulatory Visit (HOSPITAL_COMMUNITY): Payer: Self-pay

## 2021-05-15 ENCOUNTER — Other Ambulatory Visit: Payer: Self-pay

## 2021-05-15 ENCOUNTER — Ambulatory Visit
Admission: RE | Admit: 2021-05-15 | Discharge: 2021-05-15 | Disposition: A | Discharge status: Home / Self Care | Payer: 59 | Source: Ambulatory Visit | Attending: Adult Health | Admitting: Adult Health

## 2021-05-15 DIAGNOSIS — Z0000 Encounter for general adult medical examination without abnormal findings: Secondary | ICD-10-CM

## 2021-05-15 DIAGNOSIS — Z1231 Encounter for screening mammogram for malignant neoplasm of breast: Secondary | ICD-10-CM | POA: Diagnosis not present

## 2021-05-19 ENCOUNTER — Other Ambulatory Visit: Payer: Self-pay | Admitting: Adult Health

## 2021-05-19 DIAGNOSIS — R928 Other abnormal and inconclusive findings on diagnostic imaging of breast: Secondary | ICD-10-CM

## 2021-05-29 ENCOUNTER — Other Ambulatory Visit: Payer: Self-pay

## 2021-05-29 ENCOUNTER — Other Ambulatory Visit: Payer: Self-pay | Admitting: Adult Health

## 2021-05-29 ENCOUNTER — Ambulatory Visit
Admission: RE | Admit: 2021-05-29 | Discharge: 2021-05-29 | Disposition: A | Discharge status: Home / Self Care | Payer: 59 | Source: Ambulatory Visit | Attending: Adult Health | Admitting: Adult Health

## 2021-05-29 DIAGNOSIS — N6323 Unspecified lump in the left breast, lower outer quadrant: Secondary | ICD-10-CM | POA: Diagnosis not present

## 2021-05-29 DIAGNOSIS — R928 Other abnormal and inconclusive findings on diagnostic imaging of breast: Secondary | ICD-10-CM

## 2021-05-29 DIAGNOSIS — R922 Inconclusive mammogram: Secondary | ICD-10-CM | POA: Diagnosis not present

## 2021-05-29 DIAGNOSIS — N6320 Unspecified lump in the left breast, unspecified quadrant: Secondary | ICD-10-CM

## 2021-05-29 DIAGNOSIS — N6324 Unspecified lump in the left breast, lower inner quadrant: Secondary | ICD-10-CM | POA: Diagnosis not present

## 2021-06-27 ENCOUNTER — Other Ambulatory Visit (HOSPITAL_COMMUNITY): Payer: Self-pay

## 2021-06-27 ENCOUNTER — Other Ambulatory Visit: Payer: Self-pay

## 2021-06-27 ENCOUNTER — Other Ambulatory Visit: Payer: Self-pay | Admitting: Adult Health

## 2021-06-27 DIAGNOSIS — I10 Essential (primary) hypertension: Secondary | ICD-10-CM

## 2021-06-27 MED ORDER — VALSARTAN-HYDROCHLOROTHIAZIDE 80-12.5 MG PO TABS
1.0000 | ORAL_TABLET | Freq: Every day | ORAL | 1 refills | Status: DC
Start: 2021-06-27 — End: 2021-09-20
  Filled 2021-06-27: qty 30, 30d supply, fill #0
  Filled 2021-08-09: qty 30, 30d supply, fill #1

## 2021-06-28 ENCOUNTER — Other Ambulatory Visit (HOSPITAL_COMMUNITY): Payer: Self-pay

## 2021-06-28 DIAGNOSIS — F17200 Nicotine dependence, unspecified, uncomplicated: Secondary | ICD-10-CM | POA: Diagnosis not present

## 2021-06-28 DIAGNOSIS — H1045 Other chronic allergic conjunctivitis: Secondary | ICD-10-CM | POA: Diagnosis not present

## 2021-06-28 DIAGNOSIS — J3089 Other allergic rhinitis: Secondary | ICD-10-CM | POA: Diagnosis not present

## 2021-06-28 DIAGNOSIS — R059 Cough, unspecified: Secondary | ICD-10-CM | POA: Diagnosis not present

## 2021-06-28 MED ORDER — IPRATROPIUM BROMIDE 0.06 % NA SOLN
NASAL | 2 refills | Status: DC
  Filled 2021-06-28: qty 45, 41d supply, fill #0

## 2021-06-28 MED ORDER — MONTELUKAST SODIUM 10 MG PO TABS
10.0000 mg | ORAL_TABLET | Freq: Every day | ORAL | 2 refills | Status: DC
  Filled 2021-06-28: qty 90, 90d supply, fill #0
  Filled 2021-10-18 – 2021-10-24 (×2): qty 90, 90d supply, fill #1

## 2021-06-28 MED ORDER — AZELASTINE HCL 0.1 % NA SOLN
NASAL | 2 refills | Status: DC
  Filled 2021-06-28: qty 90, 75d supply, fill #0

## 2021-06-28 MED ORDER — FLUTICASONE PROPIONATE 50 MCG/ACT NA SUSP
NASAL | 2 refills | Status: DC
  Filled 2021-06-28: qty 48, 90d supply, fill #0

## 2021-06-28 MED ORDER — LEVOCETIRIZINE DIHYDROCHLORIDE 5 MG PO TABS
5.0000 mg | ORAL_TABLET | Freq: Every evening | ORAL | 6 refills | Status: DC
  Filled 2021-06-28: qty 60, 30d supply, fill #0
  Filled 2021-06-28: qty 30, 30d supply, fill #0
  Filled 2021-08-09: qty 30, 30d supply, fill #1
  Filled 2021-09-20: qty 30, 30d supply, fill #2
  Filled 2021-10-18 – 2021-10-24 (×2): qty 30, 30d supply, fill #3
  Filled 2021-11-29: qty 30, 30d supply, fill #4
  Filled 2021-12-01: qty 30, 30d supply, fill #0

## 2021-06-28 MED ORDER — ALBUTEROL SULFATE HFA 108 (90 BASE) MCG/ACT IN AERS
1.0000 | INHALATION_SPRAY | RESPIRATORY_TRACT | 0 refills | Status: DC
  Filled 2021-06-28: qty 18, 17d supply, fill #0

## 2021-06-28 MED ORDER — AZELASTINE HCL 0.05 % OP SOLN
1.0000 [drp] | Freq: Two times a day (BID) | OPHTHALMIC | 2 refills | Status: DC
  Filled 2021-06-28: qty 6, 30d supply, fill #0

## 2021-06-30 ENCOUNTER — Other Ambulatory Visit (HOSPITAL_COMMUNITY): Payer: Self-pay

## 2021-06-30 ENCOUNTER — Telehealth: Payer: Self-pay | Admitting: Adult Health

## 2021-06-30 NOTE — Telephone Encounter (Signed)
Left message to return phone call.

## 2021-06-30 NOTE — Telephone Encounter (Signed)
PT called to talk to [facility] about a pain in her left arm. 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

## 2021-06-30 NOTE — Telephone Encounter (Signed)
LVM instructions for pt to return call to discuss arm pain.

## 2021-07-04 NOTE — Telephone Encounter (Signed)
Called pt and she stated she has gotten the issue taking care of since she did not receive a call from our office. Pt advised that we reached out 2x and lm but pt denied receiving call. Pt stated that she used lidocaine and is set up for an X-Ray. No further action is needed.

## 2021-07-13 ENCOUNTER — Ambulatory Visit (HOSPITAL_BASED_OUTPATIENT_CLINIC_OR_DEPARTMENT_OTHER): Payer: 59 | Admitting: Obstetrics & Gynecology

## 2021-08-03 ENCOUNTER — Emergency Department (HOSPITAL_BASED_OUTPATIENT_CLINIC_OR_DEPARTMENT_OTHER)
Admission: EM | Admit: 2021-08-03 | Discharge: 2021-08-04 | Disposition: A | Discharge status: Home / Self Care | Payer: 59 | Attending: Emergency Medicine | Admitting: Emergency Medicine

## 2021-08-03 ENCOUNTER — Encounter (HOSPITAL_BASED_OUTPATIENT_CLINIC_OR_DEPARTMENT_OTHER): Payer: Self-pay

## 2021-08-03 ENCOUNTER — Other Ambulatory Visit: Payer: Self-pay

## 2021-08-03 ENCOUNTER — Emergency Department (HOSPITAL_BASED_OUTPATIENT_CLINIC_OR_DEPARTMENT_OTHER): Payer: 59 | Admitting: Radiology

## 2021-08-03 DIAGNOSIS — Z79899 Other long term (current) drug therapy: Secondary | ICD-10-CM | POA: Insufficient documentation

## 2021-08-03 DIAGNOSIS — Z20822 Contact with and (suspected) exposure to covid-19: Secondary | ICD-10-CM | POA: Insufficient documentation

## 2021-08-03 DIAGNOSIS — R112 Nausea with vomiting, unspecified: Secondary | ICD-10-CM | POA: Insufficient documentation

## 2021-08-03 DIAGNOSIS — Z87891 Personal history of nicotine dependence: Secondary | ICD-10-CM | POA: Insufficient documentation

## 2021-08-03 DIAGNOSIS — I10 Essential (primary) hypertension: Secondary | ICD-10-CM | POA: Insufficient documentation

## 2021-08-03 DIAGNOSIS — R519 Headache, unspecified: Secondary | ICD-10-CM | POA: Diagnosis not present

## 2021-08-03 DIAGNOSIS — R197 Diarrhea, unspecified: Secondary | ICD-10-CM | POA: Insufficient documentation

## 2021-08-03 DIAGNOSIS — R000 Tachycardia, unspecified: Secondary | ICD-10-CM | POA: Diagnosis not present

## 2021-08-03 LAB — CBC WITH DIFFERENTIAL/PLATELET
Abs Immature Granulocytes: 0.03 10*3/uL (ref 0.00–0.07)
Basophils Absolute: 0 10*3/uL (ref 0.0–0.1)
Basophils Relative: 0 %
Eosinophils Absolute: 0.1 10*3/uL (ref 0.0–0.5)
Eosinophils Relative: 1 %
HCT: 41.7 % (ref 36.0–46.0)
Hemoglobin: 14.2 g/dL (ref 12.0–15.0)
Immature Granulocytes: 0 %
Lymphocytes Relative: 13 %
Lymphs Abs: 1.6 10*3/uL (ref 0.7–4.0)
MCH: 31.6 pg (ref 26.0–34.0)
MCHC: 34.1 g/dL (ref 30.0–36.0)
MCV: 92.7 fL (ref 80.0–100.0)
Monocytes Absolute: 0.5 10*3/uL (ref 0.1–1.0)
Monocytes Relative: 4 %
Neutro Abs: 9.4 10*3/uL — ABNORMAL HIGH (ref 1.7–7.7)
Neutrophils Relative %: 82 %
Platelets: UNDETERMINED 10*3/uL (ref 150–400)
RBC: 4.5 MIL/uL (ref 3.87–5.11)
RDW: 13.5 % (ref 11.5–15.5)
WBC: 11.6 10*3/uL — ABNORMAL HIGH (ref 4.0–10.5)
nRBC: 0 % (ref 0.0–0.2)

## 2021-08-03 LAB — RESP PANEL BY RT-PCR (FLU A&B, COVID) ARPGX2
Influenza A by PCR: NEGATIVE
Influenza B by PCR: NEGATIVE
SARS Coronavirus 2 by RT PCR: NEGATIVE

## 2021-08-03 LAB — COMPREHENSIVE METABOLIC PANEL
ALT: 10 U/L (ref 0–44)
AST: 14 U/L — ABNORMAL LOW (ref 15–41)
Albumin: 4.5 g/dL (ref 3.5–5.0)
Alkaline Phosphatase: 95 U/L (ref 38–126)
Anion gap: 22 — ABNORMAL HIGH (ref 5–15)
BUN: 8 mg/dL (ref 6–20)
CO2: 23 mmol/L (ref 22–32)
Calcium: 9.4 mg/dL (ref 8.9–10.3)
Chloride: 98 mmol/L (ref 98–111)
Creatinine, Ser: 0.79 mg/dL (ref 0.44–1.00)
GFR, Estimated: >60 mL/min (ref >60)
Glucose, Bld: 110 mg/dL — ABNORMAL HIGH (ref 70–99)
Potassium: 3.2 mmol/L — ABNORMAL LOW (ref 3.5–5.1)
Sodium: 143 mmol/L (ref 135–145)
Total Bilirubin: 0.6 mg/dL (ref 0.3–1.2)
Total Protein: 7.4 g/dL (ref 6.5–8.1)

## 2021-08-03 LAB — LIPASE, BLOOD: Lipase: 15 U/L (ref 11–51)

## 2021-08-03 MED ORDER — DIPHENHYDRAMINE HCL 50 MG/ML IJ SOLN
25.0000 mg | Freq: Once | INTRAMUSCULAR | Status: AC
  Administered 2021-08-03: 25 mg via INTRAVENOUS
  Filled 2021-08-03: qty 1

## 2021-08-03 MED ORDER — SODIUM CHLORIDE 0.9 % IV BOLUS (SEPSIS)
1000.0000 mL | Freq: Once | INTRAVENOUS | Status: AC
  Administered 2021-08-03: 1000 mL via INTRAVENOUS

## 2021-08-03 MED ORDER — SODIUM CHLORIDE 0.9 % IV SOLN
1000.0000 mL | INTRAVENOUS | Status: DC
  Administered 2021-08-04: 1000 mL via INTRAVENOUS

## 2021-08-03 MED ORDER — METOCLOPRAMIDE HCL 5 MG/ML IJ SOLN
10.0000 mg | Freq: Once | INTRAMUSCULAR | Status: AC
  Administered 2021-08-03: 10 mg via INTRAVENOUS
  Filled 2021-08-03: qty 2

## 2021-08-03 MED ORDER — ONDANSETRON HCL 4 MG/2ML IJ SOLN
INTRAMUSCULAR | Status: AC
  Administered 2021-08-03: 4 mg
  Filled 2021-08-03: qty 2

## 2021-08-03 NOTE — ED Triage Notes (Signed)
Pt arrived at work and began having severe n/v/d.

## 2021-08-03 NOTE — ED Provider Notes (Signed)
[facility] EMERGENCY DEPT Provider Note   CSN: [id number] Arrival date & time: 08/03/21  1952     History Chief Complaint  Patient presents with   Nausea   Vomiting    Stacy Berry is a 50 y.o. female presenting for evaluation nausea, vomiting, diarrhea.  Patient states she has had diarrhea today.  Later tonight, she developed nausea and vomiting.  She is still feeling very nauseous and dry heaving, but nothing is coming up.  Since then, she has been feeling hot and cold, feeling that is hard for her to breathe.  No cough or congestion.  No fevers.  No abdominal pain.  No urinary symptoms.  No history of similar.  Has not taken anything for her symptoms, nothing makes it better or worse. She had not had much to eat today. No known sick contacts.   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   Leiomyoma of uterus, unspecified 05/11/2013   Lipoma of abdominal wall 09/03/2012    Past Surgical History:  Procedure Laterality Date   ECTOPIC PREGNANCY SURGERY  1990   ENDOMETRIAL ABLATION     GYNECOLOGIC CRYOSURGERY     MASS EXCISION Right 06/14/2020   Procedure: EXCISION OF SOFT TISSUE MASS RIGHT FLANK;  Surgeon: Jesusita Oka, MD;  Location: [place];  Service: General;  Laterality: Right;   [place] Right 06/15/2013   Procedure: ROBOTIC ASSISTED TOTAL HYSTERECTOMY AND RIGHT SALINGECTOMY;  Surgeon: Lahoma Crocker, MD;  Location: [facility];  Service: Gynecology;  Laterality: Right;     OB History     Gravida  5   Para  4   Term  4   Preterm      AB  1   Living  4      SAB       IAB      Ectopic  1   Multiple      Live Births  4           Family History  Problem Relation Age of Onset   Breast cancer Mother    Prostate cancer Father    Cancer Paternal Grandfather    Breast cancer Maternal Grandmother    Diabetes Maternal Grandfather    Breast cancer Sister    Colon cancer Neg Hx    Colon polyps Neg Hx    Esophageal cancer Neg Hx    Rectal cancer Neg Hx    Stomach cancer Neg Hx     Social History   Tobacco Use   Smoking status: Former    Packs/day: 0.01    Years: 10.00    Pack years: 0.10    Types: Cigarettes   Smokeless tobacco: Never  Vaping Use   Vaping Use: Never used  Substance Use Topics   Alcohol use: Yes    Comment: occasional   Drug use: No    Home Medications Prior to Admission medications   Medication Sig Start Date End Date Taking? Authorizing Provider  albuterol (VENTOLIN HFA) 108 (90  Base) MCG/ACT inhaler INHALE 1 TO 2 PUFFS EVERY 4 TO 6 HOURS AS NEEDED FOR COUGH/WHEEZING 10/04/20 10/04/21  Mosetta Anis, MD  albuterol (VENTOLIN HFA) 108 (90 Base) MCG/ACT inhaler Inhale 1-2 puffs into the lungs every 4 to 6 hours as needed for cough Teryl Lucy 06/28/21     amoxicillin-clavulanate (AUGMENTIN) 875-125 MG tablet Take 1 tablet by mouth 2 (two) times daily. 03/07/21   Burchette, Alinda Sierras, MD  azelastine (ASTELIN) 0.1 % nasal spray Place 2 sprays into both nostrils 2 (two) times daily. Use in each nostril as directed    [provider]  azelastine (ASTELIN) 0.1 % nasal spray Use 1 to 2 sprays in each nostril 2 times daily 06/28/21     azelastine (OPTIVAR) 0.05 % ophthalmic solution Place 1 drop into both eyes 2 (two) times daily as needed for allergies. 05/06/20   [provider]  azelastine (OPTIVAR) 0.05 % ophthalmic solution Instill 1 drop into affected eye 2 times  a day 06/28/21     CALCIUM PO Take by mouth.    [provider]  COVID-19 At Home Antigen Test [facility] COVID-19 HOME TEST) KIT use as  directed 05/12/21   Jefm Bryant, RPH  Cyanocobalamin (B-12 PO) Take by mouth.    [provider]  ELDERBERRY PO Take by mouth.    [provider]  fluticasone (FLONASE) 50 MCG/ACT nasal spray USe 1 to 2 sprays in each nostril once daily 06/28/21     ipratropium (ATROVENT) 0.06 % nasal spray Use 2 sprays in each nostril 3 times daily 06/28/21     levocetirizine (XYZAL) 5 MG tablet TAKE 1 TABLET BY MOUTH ONCE DAILY 12/19/20 12/19/21  Mosetta Anis, MD  levocetirizine (XYZAL) 5 MG tablet Take 1 tablet by mouth every evening. 06/28/21     MAGNESIUM PO Take by mouth.    [provider]  montelukast (SINGULAIR) 10 MG tablet TAKE 1 TABLET BY MOUTH ONCE DAILY EVERY EVENING 12/19/20 12/19/21  Mosetta Anis, MD  montelukast (SINGULAIR) 10 MG tablet TAKE 1 TABLET BY MOUTH ONCE A DAY 05/06/20 05/06/21  Mosetta Anis, MD  montelukast (SINGULAIR) 10 MG tablet Take 1 tablet (10 mg total) by mouth daily. 03/07/21     montelukast (SINGULAIR) 10 MG tablet Take 1 tablet (10 mg total) by mouth daily. 06/28/21     Multiple Vitamins-Minerals (ZINC PO) Take by mouth.    [provider]  omeprazole (PRILOSEC) 40 MG capsule TAKE 1 CAPSULE BY MOUTH ONCE DAILY 30 MINUTES BEFORE EVENING MEAL. 10/03/20 10/03/21  Spainhour, Camelia Eng, PA  predniSONE (DELTASONE) 10 MG tablet Take 4 tablets by mouth for 3 days then 3 tablets for 2 days then 2 tablets for 2 days then 1 tablet for 2 days. 04/12/21   Burchette, Alinda Sierras, MD  Prenatal Vit-Fe Fumarate-FA (PRENATAL MULTIVITAMIN) TABS tablet Take 1 tablet by mouth daily at 12 noon.    [provider]  simvastatin (ZOCOR) 10 MG tablet TAKE 1 TABLET (10 MG TOTAL) BY MOUTH AT BEDTIME. 12/09/20 12/09/21  Nafziger, Tommi Rumps, NP  Specialty Vitamins Products (MAGNESIUM, AMINO ACID CHELATE,) 133 MG tablet Take 1 tablet by mouth 2 (two) times daily.    [provider]  triamcinolone cream (KENALOG) 0.1 % Apply 1 application topically 2 (two) times daily. 04/12/21    Burchette, Alinda Sierras, MD  valsartan-hydrochlorothiazide (DIOVAN-HCT) 80-12.5 MG tablet TAKE 1 TABLET BY MOUTH DAILY. 06/27/21   Nafziger, Tommi Rumps, NP  amLODipine (NORVASC) 5 MG tablet TAKE 1 TABLET BY  MOUTH DAILY. **DUE FOR YEARLY PHYSICAL** 11/01/20 12/08/20  Dorothyann Peng, NP    Allergies    Shellfish allergy and Cocoa  Review of Systems   Review of Systems  Constitutional:  Positive for appetite change.  Gastrointestinal:  Positive for diarrhea, nausea and vomiting.  All other systems reviewed and are negative.  Physical Exam Updated Vital Signs BP (!) 139/101 (BP Location: Left Arm)   Pulse 98   Temp 98 F (36.7 C) (Oral)   Resp 20   Ht _0  (1.651 m)   Wt 91.6 kg   LMP 05/29/2013   SpO2 100%   BMI 33.61 kg/m   Physical Exam Vitals and nursing note reviewed.  Constitutional:      General: She is not in acute distress.    Appearance: Normal appearance.     Comments: nontoxic  HENT:     Head: Normocephalic and atraumatic.  Eyes:     Conjunctiva/sclera: Conjunctivae normal.     Pupils: Pupils are equal, round, and reactive to light.  Cardiovascular:     Rate and Rhythm: Normal rate and regular rhythm.     Pulses: Normal pulses.  Pulmonary:     Effort: Pulmonary effort is normal. No respiratory distress.     Breath sounds: Normal breath sounds. No wheezing.     Comments: Speaking in full sentences.  Clear lung sounds in all fields. Abdominal:     General: There is no distension.     Palpations: Abdomen is soft. There is no mass.     Tenderness: There is no abdominal tenderness. There is no guarding or rebound.     Comments: No ttp. Dry heaves during exam.   Musculoskeletal:        General: Normal range of motion.     Cervical back: Normal range of motion and neck supple.  Skin:    General: Skin is warm and dry.     Capillary Refill: Capillary refill takes less than 2 seconds.  Neurological:     Mental Status: She is alert and oriented to person, place, and time.   Psychiatric:        Mood and Affect: Mood and affect normal.        Speech: Speech normal.        Behavior: Behavior normal.    ED Results / Procedures / Treatments   Labs (all labs ordered are listed, but only abnormal results are displayed) Labs Reviewed  RESP PANEL BY RT-PCR (FLU A&B, COVID) ARPGX2  CBC WITH DIFFERENTIAL/PLATELET  COMPREHENSIVE METABOLIC PANEL  LIPASE, BLOOD  URINALYSIS, ROUTINE W REFLEX MICROSCOPIC    EKG None  Radiology No results found.  Procedures Procedures   Medications Ordered in ED Medications  metoCLOPramide (REGLAN) injection 10 mg (has no administration in time range)  diphenhydrAMINE (BENADRYL) injection 25 mg (has no administration in time range)  sodium chloride 0.9 % bolus 1,000 mL (1,000 mLs Intravenous New Bag/Given 08/03/21 2100)    Followed by  0.9 %  sodium chloride infusion (has no administration in time range)  ondansetron (ZOFRAN) 4 MG/2ML injection (4 mg  Given 08/03/21 2020)    ED Course  I have reviewed the triage vital signs and the nursing notes.  Pertinent labs & imaging results that were available during my care of the patient were reviewed by me and considered in my medical decision making (see chart for details).    MDM Rules/Calculators/A&P  Patient presenting for evaluation nausea, vomiting, diarrhea.  On exam, patient appears nontoxic, though she is having dry heaves during the exam.  No focal abdominal tenderness.  Will obtain labs, treat symptomatically, and reassess.  On reassessment, patient reports she is feeling much better after medications.  She still has lightheadedness when she sits up, but nausea and vomiting has resolved.  She has not had anything to eat or drink today, this may be the cause.  Labs are still pending.  Pt signed out to Calla Kicks, MD for f/u on labs and reassessment  Final Clinical Impression(s) / ED Diagnoses Final diagnoses:  None    Rx / DC  Orders ED Discharge Orders     None        Franchot Heidelberg, PA-C 08/03/21 2208    Charlesetta Shanks, MD 08/18/21 1003

## 2021-08-04 ENCOUNTER — Emergency Department (HOSPITAL_BASED_OUTPATIENT_CLINIC_OR_DEPARTMENT_OTHER): Payer: 59

## 2021-08-04 DIAGNOSIS — Z87891 Personal history of nicotine dependence: Secondary | ICD-10-CM | POA: Diagnosis not present

## 2021-08-04 DIAGNOSIS — R112 Nausea with vomiting, unspecified: Secondary | ICD-10-CM | POA: Diagnosis not present

## 2021-08-04 DIAGNOSIS — R197 Diarrhea, unspecified: Secondary | ICD-10-CM | POA: Diagnosis not present

## 2021-08-04 DIAGNOSIS — Z79899 Other long term (current) drug therapy: Secondary | ICD-10-CM | POA: Diagnosis not present

## 2021-08-04 DIAGNOSIS — Z20822 Contact with and (suspected) exposure to covid-19: Secondary | ICD-10-CM | POA: Diagnosis not present

## 2021-08-04 DIAGNOSIS — I10 Essential (primary) hypertension: Secondary | ICD-10-CM | POA: Diagnosis not present

## 2021-08-04 DIAGNOSIS — R519 Headache, unspecified: Secondary | ICD-10-CM | POA: Diagnosis not present

## 2021-08-04 MED ORDER — ALUM & MAG HYDROXIDE-SIMETH 200-200-20 MG/5ML PO SUSP
30.0000 mL | Freq: Once | ORAL | Status: AC
  Administered 2021-08-04: 30 mL via ORAL
  Filled 2021-08-04: qty 30

## 2021-08-04 MED ORDER — LIDOCAINE VISCOUS HCL 2 % MT SOLN
15.0000 mL | Freq: Once | OROMUCOSAL | Status: AC
  Administered 2021-08-04: 15 mL via ORAL
  Filled 2021-08-04: qty 15

## 2021-08-04 MED ORDER — KETOROLAC TROMETHAMINE 30 MG/ML IJ SOLN
30.0000 mg | Freq: Once | INTRAMUSCULAR | Status: AC
  Administered 2021-08-04: 30 mg via INTRAVENOUS
  Filled 2021-08-04: qty 1

## 2021-08-04 MED ORDER — SODIUM CHLORIDE 0.9 % IV BOLUS
1000.0000 mL | Freq: Once | INTRAVENOUS | Status: AC
  Administered 2021-08-04: 1000 mL via INTRAVENOUS

## 2021-08-04 MED ORDER — HYDROCHLOROTHIAZIDE 25 MG PO TABS
25.0000 mg | ORAL_TABLET | Freq: Once | ORAL | Status: AC
  Administered 2021-08-04: 25 mg via ORAL
  Filled 2021-08-04: qty 1

## 2021-08-04 MED ORDER — ACETAMINOPHEN 500 MG PO TABS
1000.0000 mg | ORAL_TABLET | Freq: Once | ORAL | Status: AC
  Administered 2021-08-04: 1000 mg via ORAL
  Filled 2021-08-04: qty 2

## 2021-08-04 MED ORDER — LACTATED RINGERS IV BOLUS: 1000.0000 mL | Freq: Once | INTRAVENOUS | Status: DC

## 2021-08-04 MED ORDER — BUTALBITAL-APAP-CAFFEINE 50-325-40 MG PO TABS: 1.0000 | ORAL_TABLET | Freq: Once | ORAL | Status: DC

## 2021-08-04 MED ORDER — FAMOTIDINE IN NACL 20-0.9 MG/50ML-% IV SOLN
20.0000 mg | Freq: Once | INTRAVENOUS | Status: AC
  Administered 2021-08-04: 20 mg via INTRAVENOUS
  Filled 2021-08-04: qty 50

## 2021-08-04 NOTE — ED Notes (Signed)
Pt ambulated off unit AMA with steady gait, no assistance needed

## 2021-08-04 NOTE — ED Notes (Signed)
Pt attempted to void and had diarrhea at the same time, specimen contaminated.

## 2021-08-04 NOTE — ED Notes (Signed)
Pt requesting to leave IV's be removed and requesting to leave per another staff member

## 2021-08-04 NOTE — ED Provider Notes (Signed)
1:09 AM Assumed care from Dr. Johnney Killian, please see their note for full history, physical and decision making until this point. In brief this is a 50 y.o. year old female who presented to the ED tonight with Nausea and Vomiting     Has some chest burning, headache currently. Still decreased PO but not actively nauseous at this time. Will order meds. Reeval in a bit. On reevaluation, has had headache and continued nausea/vomiting. Ct head added on, more meds provided.   Reeval and sleeping comfortably. Mostly symptom free. Abdomen benign. Neuro reassuring. Plan for dc on antiemetics.   Discharge instructions, including strict return precautions for new or worsening symptoms, given. Patient and/or family verbalized understanding and agreement with the plan as described.   Labs, studies and imaging reviewed by myself and considered in medical decision making if ordered. Imaging interpreted by radiology.  Labs Reviewed  CBC WITH DIFFERENTIAL/PLATELET - Abnormal; Notable for the following components:      Result Value   WBC 11.6 (*)    Neutro Abs 9.4 (*)    All other components within normal limits  COMPREHENSIVE METABOLIC PANEL - Abnormal; Notable for the following components:   Potassium 3.2 (*)    Glucose, Bld 110 (*)    AST 14 (*)    Anion gap 22 (*)    All other components within normal limits  RESP PANEL BY RT-PCR (FLU A&B, COVID) ARPGX2  LIPASE, BLOOD  URINALYSIS, ROUTINE W REFLEX MICROSCOPIC    DG Chest 2 View  Final Result      No follow-ups on file.    Merrily Pew, MD 08/04/21 [phone]

## 2021-08-09 ENCOUNTER — Other Ambulatory Visit (HOSPITAL_COMMUNITY): Payer: Self-pay

## 2021-09-20 ENCOUNTER — Other Ambulatory Visit: Payer: Self-pay | Admitting: Adult Health

## 2021-09-20 ENCOUNTER — Other Ambulatory Visit (HOSPITAL_COMMUNITY): Payer: Self-pay

## 2021-09-20 DIAGNOSIS — I10 Essential (primary) hypertension: Secondary | ICD-10-CM

## 2021-09-20 MED ORDER — VALSARTAN-HYDROCHLOROTHIAZIDE 80-12.5 MG PO TABS
1.0000 | ORAL_TABLET | Freq: Every day | ORAL | 1 refills | Status: DC
Start: 2021-09-20 — End: 2021-11-29
  Filled 2021-09-20: qty 30, 30d supply, fill #0
  Filled 2021-10-18 – 2021-10-24 (×2): qty 30, 30d supply, fill #1

## 2021-10-19 ENCOUNTER — Other Ambulatory Visit (HOSPITAL_COMMUNITY): Payer: Self-pay

## 2021-10-24 ENCOUNTER — Other Ambulatory Visit (HOSPITAL_COMMUNITY): Payer: Self-pay

## 2021-11-16 ENCOUNTER — Telehealth: Payer: Self-pay | Admitting: Nurse Practitioner

## 2021-11-16 NOTE — Telephone Encounter (Signed)
Pt is wanting to move her medical care to [place] from [facility]. She says [facility] is closer to her and she would prefer a female provider. She has gone ahead and scheduled her own appointment through [place]. Please let me know if this is ok.

## 2021-11-17 NOTE — Telephone Encounter (Signed)
Noted  

## 2021-11-17 NOTE — Telephone Encounter (Signed)
OK to transfer care?

## 2021-11-29 ENCOUNTER — Other Ambulatory Visit (HOSPITAL_COMMUNITY): Payer: Self-pay

## 2021-11-29 ENCOUNTER — Other Ambulatory Visit: Payer: Self-pay | Admitting: Adult Health

## 2021-11-29 DIAGNOSIS — I10 Essential (primary) hypertension: Secondary | ICD-10-CM

## 2021-11-30 ENCOUNTER — Other Ambulatory Visit: Payer: 59

## 2021-11-30 ENCOUNTER — Other Ambulatory Visit (HOSPITAL_COMMUNITY): Payer: Self-pay

## 2021-11-30 ENCOUNTER — Other Ambulatory Visit (HOSPITAL_BASED_OUTPATIENT_CLINIC_OR_DEPARTMENT_OTHER): Payer: 59

## 2021-11-30 ENCOUNTER — Inpatient Hospital Stay: Admission: RE | Admit: 2021-11-30 | Payer: 59 | Source: Ambulatory Visit

## 2021-11-30 MED ORDER — VALSARTAN-HYDROCHLOROTHIAZIDE 80-12.5 MG PO TABS
1.0000 | ORAL_TABLET | Freq: Every day | ORAL | 0 refills | Status: DC
  Filled 2021-11-30: qty 30, 30d supply, fill #0

## 2021-11-30 NOTE — Telephone Encounter (Signed)
Patient need to schedule a cpe for more refills. 

## 2021-12-01 ENCOUNTER — Other Ambulatory Visit (HOSPITAL_BASED_OUTPATIENT_CLINIC_OR_DEPARTMENT_OTHER): Payer: Self-pay

## 2021-12-01 ENCOUNTER — Other Ambulatory Visit (HOSPITAL_COMMUNITY): Payer: Self-pay

## 2021-12-04 ENCOUNTER — Encounter: Payer: Self-pay | Admitting: Nurse Practitioner

## 2021-12-04 ENCOUNTER — Other Ambulatory Visit: Payer: Self-pay

## 2021-12-04 ENCOUNTER — Other Ambulatory Visit (HOSPITAL_COMMUNITY): Payer: Self-pay

## 2021-12-04 ENCOUNTER — Ambulatory Visit: Payer: 59 | Admitting: Nurse Practitioner

## 2021-12-04 VITALS — BP 138/94 | HR 94 | Temp 96.3°F | Ht 65.0 in | Wt 213.4 lb

## 2021-12-04 DIAGNOSIS — Z23 Encounter for immunization: Secondary | ICD-10-CM | POA: Diagnosis not present

## 2021-12-04 DIAGNOSIS — R0789 Other chest pain: Secondary | ICD-10-CM | POA: Diagnosis not present

## 2021-12-04 DIAGNOSIS — L309 Dermatitis, unspecified: Secondary | ICD-10-CM | POA: Diagnosis not present

## 2021-12-04 DIAGNOSIS — N63 Unspecified lump in unspecified breast: Secondary | ICD-10-CM

## 2021-12-04 DIAGNOSIS — J302 Other seasonal allergic rhinitis: Secondary | ICD-10-CM

## 2021-12-04 DIAGNOSIS — E785 Hyperlipidemia, unspecified: Secondary | ICD-10-CM

## 2021-12-04 DIAGNOSIS — Z8742 Personal history of other diseases of the female genital tract: Secondary | ICD-10-CM | POA: Diagnosis not present

## 2021-12-04 DIAGNOSIS — I10 Essential (primary) hypertension: Secondary | ICD-10-CM | POA: Diagnosis not present

## 2021-12-04 MED ORDER — VALSARTAN-HYDROCHLOROTHIAZIDE 160-25 MG PO TABS
1.0000 | ORAL_TABLET | Freq: Every day | ORAL | 0 refills | Status: DC
  Filled 2021-12-04 – 2021-12-28 (×2): qty 90, 90d supply, fill #0

## 2021-12-04 MED ORDER — CLOTRIMAZOLE-BETAMETHASONE 1-0.05 % EX CREA
1.0000 "application " | TOPICAL_CREAM | Freq: Every day | CUTANEOUS | 0 refills | Status: DC
  Filled 2021-12-04 – 2021-12-08 (×2): qty 30, 30d supply, fill #0

## 2021-12-04 NOTE — Assessment & Plan Note (Signed)
Ongoing for several months with 2 areas to her right ankle and anterior shin. She was prescribed triamcinolone cream which did not help. Will send in lotrisone cream. If still having ongoing itching or rash, will refer to dermatology.

## 2021-12-04 NOTE — Assessment & Plan Note (Signed)
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 Will continue routine pap screening.

## 2021-12-04 NOTE — Assessment & Plan Note (Signed)
Chronic, follows with allergy specialist. Takes singulair and xyzal to control symptoms. Continue collaboration and recommendations from allergist.

## 2021-12-04 NOTE — Assessment & Plan Note (Signed)
Assymetry noted on last mammogram and diagnostic mammogram and ultrasound showed a mass in her left breast at 6 o'clock position. Recommended repeat ultrasound in 6 months, then 12 months. She states she missed her appointment for the 6 month ultrasound a few days ago. Encouraged her to call and reschedule.

## 2021-12-04 NOTE — Progress Notes (Signed)
Established Patient Office Visit  Subjective:  Patient ID: Stacy Berry, female    DOB: 07-Sep-1971  Age: 51 y.o. MRN: [id number]  CC:  Chief Complaint  Patient presents with   Transitions Of Care    TOC. Est care. BP check. Pt requesting CPE.     HPI Stacy Berry presents to transfer care to a new provider.  Introduced to Designer, jewellery role and practice setting.  All questions answered.  Discussed provider/patient relationship and expectations.  HYPERTENSION / HYPERLIPIDEMIA  Satisfied with current treatment? no Duration of hypertension: chronic BP monitoring frequency: a few times a week BP range: 173/123 BP medication side effects: no Past BP meds: valsartan-hctz Duration of hyperlipidemia: chronic Cholesterol medication side effects: no Cholesterol supplements: none Past cholesterol medications: none- she stopped simvastatin years ago but doesn't remember why Medication compliance: excellent compliance Aspirin: no Recent stressors: no Recurrent headaches: no Visual changes: no Palpitations: yes Dyspnea: yes - with chest pressure episodes Chest pain: yes - see below Lower extremity edema: no Dizzy/lightheaded:  sometimes  CHEST PAIN  Time since onset: Duration:weeks Onset: sudden Quality: pressure, numbness Severity: severe Location: upper left Radiation: none Episode duration: 5 minutes Frequency: a few times a week Related to exertion: no Activity when pain started: laying down Trauma: no Anxiety/recent stressors: yes Aggravating factors:  Alleviating factors:  Status: fluctuating Treatments attempted: nothing  Current pain status: pain free Shortness of breath: yes - with chest pressure episodes Cough: no Nausea: no Diaphoresis: no Heartburn: no Palpitations: yes  Past Medical History:  Diagnosis Date   Allergy    seasonal allergies   Ectopic pregnancy    Fibroid    History of trichomoniasis 03/03/2020   2820221.  Treated.    Hyperlipidemia    Hypertension    Infection    UTI   Lipoma     Past Surgical History:  Procedure Laterality Date   ECTOPIC PREGNANCY SURGERY  1990   ENDOMETRIAL ABLATION     GYNECOLOGIC CRYOSURGERY     MASS EXCISION Right 06/14/2020   Procedure: EXCISION OF SOFT TISSUE MASS RIGHT FLANK;  Surgeon: Jesusita Oka, MD;  Location: [facility];  Service: General;  Laterality: Right;   [place] Right 06/15/2013   Procedure: ROBOTIC ASSISTED TOTAL HYSTERECTOMY AND RIGHT SALINGECTOMY;  Surgeon: Lahoma Crocker, MD;  Location: [facility];  Service: Gynecology;  Laterality: Right;    Family History  Problem Relation Age of Onset   Breast cancer Mother    Prostate cancer Father    Cancer Paternal Grandfather    Breast cancer Maternal Grandmother    Diabetes Maternal Grandfather    Breast cancer Sister    Colon cancer Neg Hx    Colon polyps Neg Hx    Esophageal cancer Neg Hx    Rectal cancer Neg Hx    Stomach cancer Neg Hx     Social History   Socioeconomic History   Marital status: Single    Spouse name: Not on file   Number of children: Not on file   Years of education: Not on file   Highest education level: Not on file  Occupational History   Not on file  Tobacco Use   Smoking status: Former    Packs/day: 0.01    Years: 10.00    Pack years: 0.10    Types: Cigarettes   Smokeless tobacco: Never  Vaping Use   Vaping Use: Never used  Substance and Sexual Activity   Alcohol use: Yes    Comment: occasional   Drug use: No   Sexual activity: Not Currently    Birth control/protection: Surgical    Comment: hysterectomy  Other Topics Concern   Not on file  Social History Narrative   Not on file   Social Determinants of Health   Financial Resource Strain: Not on file  Food Insecurity: Not on file  Transportation Needs: Not on file  Physical Activity: Not on file  Stress: Not on file  Social Connections: Not on file   Intimate Partner Violence: Not on file    Outpatient Medications Prior to Visit  Medication Sig Dispense Refill   levocetirizine (XYZAL) 5 MG tablet TAKE 1 TABLET BY MOUTH ONCE DAILY 30 tablet 4   montelukast (SINGULAIR) 10 MG tablet Take 1 tablet (10 mg total) by mouth daily. 30 tablet 0   valsartan-hydrochlorothiazide (DIOVAN-HCT) 80-12.5 MG tablet Take 1 tablet by mouth daily. 30 tablet 0   albuterol (VENTOLIN HFA) 108 (90 Base) MCG/ACT inhaler Inhale 1-2 puffs into the lungs every 4 to 6 hours as needed for cough /wheeze (Patient not taking: Reported on 12/04/2021) 18 g 0   triamcinolone cream (KENALOG) 0.1 % Apply 1 application topically 2 (two) times daily. (Patient not taking: Reported on 12/04/2021) 30 g 0   albuterol (VENTOLIN HFA) 108 (90 Base) MCG/ACT inhaler INHALE 1 TO 2 PUFFS EVERY 4 TO 6 HOURS AS NEEDED FOR COUGH/WHEEZING 18 g 0   amoxicillin-clavulanate (AUGMENTIN) 875-125 MG tablet Take 1 tablet by mouth 2 (two) times daily. 14 tablet 0   azelastine (ASTELIN) 0.1 % nasal spray Place 2 sprays into both nostrils 2 (two) times daily. Use in each nostril as directed     azelastine (ASTELIN) 0.1 % nasal spray Use 1 to 2 sprays in each nostril 2 times daily (Patient not taking: Reported on 12/04/2021) 90 mL 2   azelastine (OPTIVAR) 0.05 % ophthalmic solution Place 1 drop into both eyes 2 (two) times daily as needed for allergies.     azelastine (OPTIVAR) 0.05 % ophthalmic solution Instill 1 drop into affected eye 2 times  a day (Patient not taking: Reported on 12/04/2021) 18 mL 2   CALCIUM PO Take by mouth. (Patient not taking: Reported on 12/04/2021)     COVID-19 At Home Antigen Test [facility] COVID-19 HOME TEST) KIT use as directed (Patient not taking: Reported on 12/04/2021) 4 each 0   Cyanocobalamin (B-12 PO) Take by mouth. (Patient not taking: Reported on 12/04/2021)     ELDERBERRY PO Take by mouth. (Patient not taking: Reported on 12/04/2021)     fluticasone (FLONASE) 50 MCG/ACT nasal  spray USe 1 to 2 sprays in each nostril once daily (Patient not taking: Reported on 12/04/2021) 48 g 2   ipratropium (ATROVENT) 0.06 % nasal spray Use 2 sprays in each nostril 3 times daily (Patient not taking: Reported on 12/04/2021) 45 mL 2   levocetirizine (XYZAL) 5 MG tablet Take 1 tablet by mouth every evening. 30 tablet 6   MAGNESIUM PO Take by mouth. (Patient not taking: Reported on 12/04/2021)     montelukast (SINGULAIR) 10 MG tablet TAKE 1 TABLET BY MOUTH ONCE DAILY EVERY EVENING 30 tablet 4   montelukast (SINGULAIR) 10 MG tablet TAKE 1 TABLET BY MOUTH ONCE A DAY 30 tablet 3   montelukast (SINGULAIR) 10 MG tablet Take 1 tablet (10 mg total) by mouth daily. 90 tablet 2   Multiple Vitamins-Minerals (ZINC PO) Take  by mouth. (Patient not taking: Reported on 12/04/2021)     omeprazole (PRILOSEC) 40 MG capsule TAKE 1 CAPSULE BY MOUTH ONCE DAILY 30 MINUTES BEFORE EVENING MEAL. 30 capsule 3   predniSONE (DELTASONE) 10 MG tablet Take 4 tablets by mouth for 3 days then 3 tablets for 2 days then 2 tablets for 2 days then 1 tablet for 2 days. (Patient not taking: Reported on 12/04/2021) 24 tablet 0   Prenatal Vit-Fe Fumarate-FA (PRENATAL MULTIVITAMIN) TABS tablet Take 1 tablet by mouth daily at 12 noon. (Patient not taking: Reported on 12/04/2021)     simvastatin (ZOCOR) 10 MG tablet TAKE 1 TABLET (10 MG TOTAL) BY MOUTH AT BEDTIME. (Patient not taking: Reported on 12/04/2021) 90 tablet 1   Specialty Vitamins Products (MAGNESIUM, AMINO ACID CHELATE,) 133 MG tablet Take 1 tablet by mouth 2 (two) times daily. (Patient not taking: Reported on 12/04/2021)     Facility-Administered Medications Prior to Visit  Medication Dose Route Frequency Provider Last Rate Last Admin   0.9 %  sodium chloride infusion  500 mL Intravenous Once Armbruster, Carlota Raspberry, MD        Allergies  Allergen Reactions   Shellfish Allergy Anaphylaxis   Cocoa Hives    ROS Review of Systems  Constitutional:  Positive for fatigue.  HENT:  Negative.    Respiratory:  Positive for shortness of breath (with chest pressure episodes).   Cardiovascular:  Positive for chest pain (intermittent episodes over the past few weeks) and palpitations.  Gastrointestinal: Negative.   Genitourinary: Negative.   Musculoskeletal: Negative.   Skin:  Positive for rash.  Neurological: Negative.   Psychiatric/Behavioral: Negative.       Objective:    Physical Exam Vitals and nursing note reviewed.  Constitutional:      General: She is not in acute distress.    Appearance: Normal appearance.  HENT:     Head: Normocephalic and atraumatic.     Right Ear: Tympanic membrane, ear canal and external ear normal.     Left Ear: Tympanic membrane, ear canal and external ear normal.     Nose: Nose normal.     Mouth/Throat:     Mouth: Mucous membranes are moist.     Pharynx: Oropharynx is clear.  Eyes:     Conjunctiva/sclera: Conjunctivae normal.     Pupils: Pupils are equal, round, and reactive to light.  Cardiovascular:     Rate and Rhythm: Normal rate and regular rhythm.     Pulses: Normal pulses.     Heart sounds: Normal heart sounds.  Pulmonary:     Effort: Pulmonary effort is normal.     Breath sounds: Normal breath sounds.  Abdominal:     General: Bowel sounds are normal.     Palpations: Abdomen is soft.     Tenderness: There is no abdominal tenderness.  Musculoskeletal:        General: Normal range of motion.     Cervical back: Normal range of motion and neck supple. No tenderness.     Right lower leg: No edema.     Left lower leg: No edema.  Lymphadenopathy:     Cervical: No cervical adenopathy.  Skin:    General: Skin is warm and dry.     Findings: Rash present.     Comments: Small, raised, scaly area to right ankle and right anterior shin   Neurological:     General: No focal deficit present.     Mental Status: She is alert and  oriented to person, place, and time.  Psychiatric:        Mood and Affect: Mood normal.         Behavior: Behavior normal.        Thought Content: Thought content normal.        Judgment: Judgment normal.    BP (!) 138/94 (BP Location: Right Arm, Cuff Size: Large)    Pulse 94    Temp (!) 96.3 F (35.7 C) (Temporal)    Ht 5' 5"  (1.651 m)    Wt 213 lb 6.4 oz (96.8 kg)    LMP 05/29/2013    SpO2 98%    BMI 35.51 kg/m  Wt Readings from Last 3 Encounters:  12/04/21 213 lb 6.4 oz (96.8 kg)  08/03/21 202 lb (91.6 kg)  04/12/21 212 lb 14.4 oz (96.6 kg)     There are no preventive care reminders to display for this patient.   There are no preventive care reminders to display for this patient.  Lab Results  Component Value Date   TSH 0.90 12/08/2020   Lab Results  Component Value Date   WBC 11.6 (H) 08/03/2021   HGB 14.2 08/03/2021   HCT 41.7 08/03/2021   MCV 92.7 08/03/2021   PLT PLATELET CLUMPS NOTED ON SMEAR, UNABLE TO ESTIMATE 08/03/2021   Lab Results  Component Value Date   NA 143 08/03/2021   K 3.2 (L) 08/03/2021   CO2 23 08/03/2021   GLUCOSE 110 (H) 08/03/2021   BUN 8 08/03/2021   CREATININE 0.79 08/03/2021   BILITOT 0.6 08/03/2021   ALKPHOS 95 08/03/2021   AST 14 (L) 08/03/2021   ALT 10 08/03/2021   PROT 7.4 08/03/2021   ALBUMIN 4.5 08/03/2021   CALCIUM 9.4 08/03/2021   ANIONGAP 22 (H) 08/03/2021   GFR 84.00 12/08/2020   Lab Results  Component Value Date   CHOL 221 (H) 12/08/2020   Lab Results  Component Value Date   HDL 53.70 12/08/2020   Lab Results  Component Value Date   LDLCALC 142 (H) 12/08/2020   Lab Results  Component Value Date   TRIG 126.0 12/08/2020   Lab Results  Component Value Date   CHOLHDL 4 12/08/2020   Lab Results  Component Value Date   HGBA1C 5.7 05/19/2019      Assessment & Plan:   Problem List Items Addressed This Visit       Cardiovascular and Mediastinum   Essential hypertension    Blood pressure is slightly elevated today 138/94. She has also had some elevated readings at home. Will increase valsartan-hctz  to 160-25 mg daily. Continue limiting the amount of salt in her diet and exercising. Follow up in 4 weeks.       Relevant Medications   valsartan-hydrochlorothiazide (DIOVAN-HCT) 160-25 MG tablet   Other Relevant Orders   Comprehensive metabolic panel   CBC with Differential/Platelet     Musculoskeletal and Integument   Eczema    Ongoing for several months with 2 areas to her right ankle and anterior shin. She was prescribed triamcinolone cream which did not help. Will send in lotrisone cream. If still having ongoing itching or rash, will refer to dermatology.         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 Will continue routine pap screening.       Hyperlipidemia    Chronic, she was taking simvastatin in the  past, however she states she hasn't taken it in a long time. Check lipid panel today.       Relevant Medications   valsartan-hydrochlorothiazide (DIOVAN-HCT) 160-25 MG tablet   Other Relevant Orders   Lipid panel   Chest pressure - Primary    Intermittent episodes of chest pressure, shortness of breath and palpitations the past few weeks. EKG in office shows normal sinus rhythm heart rate 77, with no ST or T wave changes. Will check CMP, CBC, and TSH and place a referral to cardiology.       Relevant Orders   EKG 12-Lead (Completed)   TSH   Mass of breast at 6 o'clock position    Assymetry noted on last mammogram and diagnostic mammogram and ultrasound showed a mass in her left breast at 6 o'clock position. Recommended repeat ultrasound in 6 months, then 12 months. She states she missed her appointment for the 6 month ultrasound a few days ago. Encouraged her to call and reschedule.       Seasonal allergies    Chronic, follows with allergy specialist. Takes singulair and xyzal to control symptoms. Continue collaboration and recommendations from allergist.       Other Visit Diagnoses      Need for Tdap vaccination       Tdap booster updated today   Relevant Orders   Tdap vaccine greater than or equal to 7yo IM (Completed)   Need for shingles vaccine       Shingrix #1 given today   Relevant Orders   Varicella-zoster vaccine IM (Completed)       Meds ordered this encounter  Medications   valsartan-hydrochlorothiazide (DIOVAN-HCT) 160-25 MG tablet    Sig: Take 1 tablet by mouth daily.    Dispense:  90 tablet    Refill:  0   clotrimazole-betamethasone (LOTRISONE) cream    Sig: Apply 1 application topically daily.    Dispense:  30 g    Refill:  0    Follow-up: Return in about 4 weeks (around 01/01/2022) for HTN.    Charyl Dancer, NP

## 2021-12-04 NOTE — Assessment & Plan Note (Signed)
Chronic, she was taking simvastatin in the past, however she states she hasn't taken it in a long time. Check lipid panel today.

## 2021-12-04 NOTE — Assessment & Plan Note (Signed)
Blood pressure is slightly elevated today 138/94. She has also had some elevated readings at home. Will increase valsartan-hctz to 160-25 mg daily. Continue limiting the amount of salt in her diet and exercising. Follow up in 4 weeks.

## 2021-12-04 NOTE — Progress Notes (Signed)
EKG interpreted by me on 12/04/21 showed normal sinus rhythm with a heart rate of 77. No ST or T wave changes.

## 2021-12-04 NOTE — Assessment & Plan Note (Signed)
Intermittent episodes of chest pressure, shortness of breath and palpitations the past few weeks. EKG in office shows normal sinus rhythm heart rate 77, with no ST or T wave changes. Will check CMP, CBC, and TSH and place a referral to cardiology.

## 2021-12-04 NOTE — Patient Instructions (Signed)
It was great to see you!  We are checking your labs and will send the results to mychart/phone.   Increase your valsartan-hctz from 80-12.5mg  to 160-50mg . You can take 2 of your current tablets daily, however when you pick up your new prescription, go back to taking 1 tablet daily since it will be the higher dose. Continue checking your blood pressure at home.   I have also placed a referral to cardiology. If you have more chest pain, shortness of breath, or any concerns, go to the ER  Let's follow-up in 4 weeks, sooner if you have concerns.  If a referral was placed today, you will be contacted for an appointment. Please note that routine referrals can sometimes take up to 3-4 weeks to process. Please call our office if you haven't heard anything after this time frame.  Take care,  Vance Peper, NP

## 2021-12-05 DIAGNOSIS — R0789 Other chest pain: Secondary | ICD-10-CM | POA: Diagnosis not present

## 2021-12-05 DIAGNOSIS — E785 Hyperlipidemia, unspecified: Secondary | ICD-10-CM | POA: Diagnosis not present

## 2021-12-05 DIAGNOSIS — N63 Unspecified lump in unspecified breast: Secondary | ICD-10-CM | POA: Diagnosis not present

## 2021-12-05 DIAGNOSIS — Z23 Encounter for immunization: Secondary | ICD-10-CM | POA: Diagnosis not present

## 2021-12-05 DIAGNOSIS — Z8742 Personal history of other diseases of the female genital tract: Secondary | ICD-10-CM | POA: Diagnosis not present

## 2021-12-05 DIAGNOSIS — L309 Dermatitis, unspecified: Secondary | ICD-10-CM | POA: Diagnosis not present

## 2021-12-05 DIAGNOSIS — I10 Essential (primary) hypertension: Secondary | ICD-10-CM | POA: Diagnosis not present

## 2021-12-05 DIAGNOSIS — J302 Other seasonal allergic rhinitis: Secondary | ICD-10-CM | POA: Diagnosis not present

## 2021-12-05 LAB — CBC WITH DIFFERENTIAL/PLATELET
Basophils Absolute: 0 10*3/uL (ref 0.0–0.1)
Basophils Relative: 0.4 % (ref 0.0–3.0)
Eosinophils Absolute: 0.1 10*3/uL (ref 0.0–0.7)
Eosinophils Relative: 1 % (ref 0.0–5.0)
HCT: 38.2 % (ref 36.0–46.0)
Hemoglobin: 12.7 g/dL (ref 12.0–15.0)
Lymphocytes Relative: 29.3 % (ref 12.0–46.0)
Lymphs Abs: 2.5 10*3/uL (ref 0.7–4.0)
MCHC: 33.2 g/dL (ref 30.0–36.0)
MCV: 95.9 fl (ref 78.0–100.0)
Monocytes Absolute: 0.8 10*3/uL (ref 0.1–1.0)
Monocytes Relative: 9.7 % (ref 3.0–12.0)
Neutro Abs: 5.2 10*3/uL (ref 1.4–7.7)
Neutrophils Relative %: 59.6 % (ref 43.0–77.0)
Platelets: 269 10*3/uL (ref 150.0–400.0)
RBC: 3.98 Mil/uL (ref 3.87–5.11)
RDW: 14.6 % (ref 11.5–15.5)
WBC: 8.6 10*3/uL (ref 4.0–10.5)

## 2021-12-05 LAB — COMPREHENSIVE METABOLIC PANEL
ALT: 13 U/L (ref 0–35)
AST: 16 U/L (ref 0–37)
Albumin: 4.3 g/dL (ref 3.5–5.2)
Alkaline Phosphatase: 90 U/L (ref 39–117)
BUN: 10 mg/dL (ref 6–23)
CO2: 26 mEq/L (ref 19–32)
Calcium: 9.6 mg/dL (ref 8.4–10.5)
Chloride: 103 mEq/L (ref 96–112)
Creatinine, Ser: 0.91 mg/dL (ref 0.40–1.20)
GFR: 72.54 mL/min (ref >60.00)
Glucose, Bld: 92 mg/dL (ref 70–99)
Potassium: 3.3 mEq/L — ABNORMAL LOW (ref 3.5–5.1)
Sodium: 140 mEq/L (ref 135–145)
Total Bilirubin: 0.2 mg/dL (ref 0.2–1.2)
Total Protein: 7.1 g/dL (ref 6.0–8.3)

## 2021-12-05 LAB — LIPID PANEL
Cholesterol: 228 mg/dL — ABNORMAL HIGH (ref 0–200)
HDL: 47.2 mg/dL (ref >39.00)
NonHDL: 181.13
Total CHOL/HDL Ratio: 5
Triglycerides: 217 mg/dL — ABNORMAL HIGH (ref 0.0–149.0)
VLDL: 43.4 mg/dL — ABNORMAL HIGH (ref 0.0–40.0)

## 2021-12-05 LAB — LDL CHOLESTEROL, DIRECT: Direct LDL: 156 mg/dL

## 2021-12-05 LAB — TSH: TSH: 0.74 u[IU]/mL (ref 0.35–5.50)

## 2021-12-08 ENCOUNTER — Other Ambulatory Visit (HOSPITAL_COMMUNITY): Payer: Self-pay

## 2021-12-08 ENCOUNTER — Other Ambulatory Visit (HOSPITAL_BASED_OUTPATIENT_CLINIC_OR_DEPARTMENT_OTHER): Payer: Self-pay

## 2021-12-28 ENCOUNTER — Other Ambulatory Visit (HOSPITAL_BASED_OUTPATIENT_CLINIC_OR_DEPARTMENT_OTHER): Payer: Self-pay

## 2021-12-29 NOTE — Progress Notes (Signed)
? ?Established Patient Office Visit ? ?Subjective:  ?Patient ID: Stacy Berry, female    DOB: [birth year]  Age: 51 y.o. MRN: [id number] ? ?CC:  ?Chief Complaint  ?Patient presents with  ? Hypertension  ?  4 wk f/u HTN  ? ? ?HPI ?Stacy Berry presents for follow-up on hypertension.  Valsartan-HCTZ was increased last visit to 160-25 mg daily.  She has not been checking her blood pressure at home.  She has only been taking the increased dose for the last week.  She denies chest pain, shortness of breath, headaches, dizziness. ? ?She has been using the Lotrisone cream on her legs which has helped with the rash.  She states that it is still itching but she is not using the cream every day as she should.  The rash is going away. ? ?Past Medical History:  ?Diagnosis Date  ? Allergy   ? seasonal allergies  ? Ectopic pregnancy   ? Fibroid   ? History of trichomoniasis 03/03/2020  ? 2820221.  Treated.  ? Hyperlipidemia   ? Hypertension   ? Infection   ? UTI  ? Lipoma   ? ? ?Past Surgical History:  ?Procedure Laterality Date  ? ECTOPIC PREGNANCY SURGERY  1990  ? ENDOMETRIAL ABLATION    ? GYNECOLOGIC CRYOSURGERY    ? MASS EXCISION Right 06/14/2020  ? Procedure: EXCISION OF SOFT TISSUE MASS RIGHT FLANK;  Surgeon: Jesusita Oka, MD;  Location: [facility];  Service: General;  Laterality: Right;  ? [place]. 2000  ? ROBOTIC ASSISTED TOTAL HYSTERECTOMY Right 06/15/2013  ? Procedure: ROBOTIC ASSISTED TOTAL HYSTERECTOMY AND RIGHT SALINGECTOMY;  Surgeon: Lahoma Crocker, MD;  Location: [facility];  Service: Gynecology;  Laterality: Right;  ? ? ?Family History  ?Problem Relation Age of Onset  ? Breast cancer Mother   ? Prostate cancer Father   ? Cancer Paternal Grandfather   ? Breast cancer Maternal Grandmother   ? Diabetes Maternal Grandfather   ? Breast cancer Sister   ? Colon cancer Neg Hx   ? Colon polyps Neg Hx   ? Esophageal cancer Neg Hx   ? Rectal cancer Neg Hx   ? Stomach cancer Neg Hx   ? ? ?Social History   ? ?Socioeconomic History  ? Marital status: Single  ?  Spouse name: Not on file  ? Number of children: Not on file  ? Years of education: Not on file  ? Highest education level: Not on file  ?Occupational History  ? Not on file  ?Tobacco Use  ? Smoking status: Former  ?  Packs/day: 0.01  ?  Years: 10.00  ?  Pack years: 0.10  ?  Types: Cigarettes  ? Smokeless tobacco: Never  ?Vaping Use  ? Vaping Use: Never used  ?Substance and Sexual Activity  ? Alcohol use: Yes  ?  Comment: occasional  ? Drug use: No  ? Sexual activity: Not Currently  ?  Birth control/protection: Surgical  ?  Comment: hysterectomy  ?Other Topics Concern  ? Not on file  ?Social History Narrative  ? Not on file  ? ?Social Determinants of Health  ? ?Financial Resource Strain: Not on file  ?Food Insecurity: Not on file  ?Transportation Needs: Not on file  ?Physical Activity: Not on file  ?Stress: Not on file  ?Social Connections: Not on file  ?Intimate Partner Violence: Not on file  ? ? ?Outpatient Medications Prior to Visit  ?Medication Sig Dispense Refill  ?  albuterol (VENTOLIN HFA) 108 (90 Base) MCG/ACT inhaler Inhale 1-2 puffs into the lungs every 4 to 6 hours as needed for cough /wheeze (Patient not taking: Reported on 12/04/2021) 18 g 0  ? clotrimazole-betamethasone (LOTRISONE) cream Apply 1 application topically daily. 30 g 0  ? levocetirizine (XYZAL) 5 MG tablet TAKE 1 TABLET BY MOUTH ONCE DAILY 30 tablet 4  ? montelukast (SINGULAIR) 10 MG tablet Take 1 tablet (10 mg total) by mouth daily. 30 tablet 0  ? triamcinolone cream (KENALOG) 0.1 % Apply 1 application topically 2 (two) times daily. (Patient not taking: Reported on 12/04/2021) 30 g 0  ? valsartan-hydrochlorothiazide (DIOVAN-HCT) 160-25 MG tablet Take 1 tablet by mouth daily. 90 tablet 0  ? ?Facility-Administered Medications Prior to Visit  ?Medication Dose Route Frequency Provider Last Rate Last Admin  ? 0.9 %  sodium chloride infusion  500 mL Intravenous Once Armbruster, Carlota Raspberry, MD       ? ? ?Allergies  ?Allergen Reactions  ? Shellfish Allergy Anaphylaxis  ? Cocoa Hives  ? ? ?ROS ?Review of Systems ?See pertinent positives and negatives per HPI. ?  ?Objective:  ?  ?Physical Exam ?Vitals and nursing note reviewed.  ?Constitutional:   ?   General: She is not in acute distress. ?   Appearance: Normal appearance.  ?HENT:  ?   Head: Normocephalic and atraumatic.  ?Eyes:  ?   Conjunctiva/sclera: Conjunctivae normal.  ?Cardiovascular:  ?   Rate and Rhythm: Normal rate and regular rhythm.  ?   Pulses: Normal pulses.  ?   Heart sounds: Normal heart sounds.  ?Pulmonary:  ?   Effort: Pulmonary effort is normal.  ?   Breath sounds: Normal breath sounds.  ?Musculoskeletal:  ?   Cervical back: Normal range of motion.  ?Skin: ?   General: Skin is warm and dry.  ?Neurological:  ?   General: No focal deficit present.  ?   Mental Status: She is alert and oriented to person, place, and time.  ?Psychiatric:     ?   Mood and Affect: Mood normal.     ?   Behavior: Behavior normal.     ?   Thought Content: Thought content normal.     ?   Judgment: Judgment normal.  ? ? ?BP 118/80 (BP Location: Right Arm, Cuff Size: Large)   Pulse 98   Ht '5\' 5"'$  (1.651 m)   Wt 209 lb 12.8 oz (95.2 kg)   LMP 05/29/2013   SpO2 97%   BMI 34.91 kg/m?  ?Wt Readings from Last 3 Encounters:  ?01/01/22 209 lb 12.8 oz (95.2 kg)  ?12/04/21 213 lb 6.4 oz (96.8 kg)  ?08/03/21 202 lb (91.6 kg)  ? ? ? ?There are no preventive care reminders to display for this patient. ? ?There are no preventive care reminders to display for this patient. ? ?Lab Results  ?Component Value Date  ? TSH 0.74 12/04/2021  ? ?Lab Results  ?Component Value Date  ? WBC 8.6 12/04/2021  ? HGB 12.7 12/04/2021  ? HCT 38.2 12/04/2021  ? MCV 95.9 12/04/2021  ? PLT 269.0 12/04/2021  ? ?Lab Results  ?Component Value Date  ? NA 140 12/04/2021  ? K 3.3 (L) 12/04/2021  ? CO2 26 12/04/2021  ? GLUCOSE 92 12/04/2021  ? BUN 10 12/04/2021  ? CREATININE 0.91 12/04/2021  ? BILITOT 0.2  12/04/2021  ? ALKPHOS 90 12/04/2021  ? AST 16 12/04/2021  ? ALT 13 12/04/2021  ? PROT 7.1 12/04/2021  ?  ALBUMIN 4.3 12/04/2021  ? CALCIUM 9.6 12/04/2021  ? ANIONGAP 22 (H) 08/03/2021  ? GFR 72.54 12/04/2021  ? ?Lab Results  ?Component Value Date  ? CHOL 228 (H) 12/04/2021  ? ?Lab Results  ?Component Value Date  ? HDL 47.20 12/04/2021  ? ?Lab Results  ?Component Value Date  ? LDLCALC 142 (H) 12/08/2020  ? ?Lab Results  ?Component Value Date  ? TRIG 217.0 (H) 12/04/2021  ? ?Lab Results  ?Component Value Date  ? CHOLHDL 5 12/04/2021  ? ?Lab Results  ?Component Value Date  ? HGBA1C 5.7 05/19/2019  ? ? ?  ?Assessment & Plan:  ? ?Problem List Items Addressed This Visit   ? ?  ? Cardiovascular and Mediastinum  ? Essential hypertension - Primary  ?  Chronic, improved.  Blood pressure is well controlled today at 118/80.  Continue valsartan-hydrochlorothiazide 160-25 mg daily.  Encouraged her to check her blood pressure at home routinely.  Continue limiting the salt in her diet and increasing exercise.  Follow-up in 6 months. ?  ?  ?  ? Musculoskeletal and Integument  ? Eczema  ?  Rash is improving with the Lotrisone cream.  Continue using daily.  Follow-up with any concerns or worsening symptoms. ?  ?  ? ? ?No orders of the defined types were placed in this encounter. ? ? ?Follow-up: Return in about 6 months (around 07/04/2022) for HTN.  ? ? ?Charyl Dancer, NP ?

## 2022-01-01 ENCOUNTER — Ambulatory Visit (INDEPENDENT_AMBULATORY_CARE_PROVIDER_SITE_OTHER): Payer: 59 | Admitting: Nurse Practitioner

## 2022-01-01 ENCOUNTER — Encounter: Payer: Self-pay | Admitting: Nurse Practitioner

## 2022-01-01 VITALS — BP 118/80 | HR 98 | Ht 65.0 in | Wt 209.8 lb

## 2022-01-01 DIAGNOSIS — L309 Dermatitis, unspecified: Secondary | ICD-10-CM | POA: Diagnosis not present

## 2022-01-01 DIAGNOSIS — I10 Essential (primary) hypertension: Secondary | ICD-10-CM

## 2022-01-01 NOTE — Assessment & Plan Note (Signed)
Rash is improving with the Lotrisone cream.  Continue using daily.  Follow-up with any concerns or worsening symptoms. ?

## 2022-01-01 NOTE — Assessment & Plan Note (Signed)
Chronic, improved.  Blood pressure is well controlled today at 118/80.  Continue valsartan-hydrochlorothiazide 160-25 mg daily.  Encouraged her to check her blood pressure at home routinely.  Continue limiting the salt in her diet and increasing exercise.  Follow-up in 6 months. ?

## 2022-01-01 NOTE — Patient Instructions (Signed)
It was great to see you! ? ?Keep taking your valsartan 160-'25mg'$  daily.  ? ?Let's follow-up in 6 months, sooner if you have concerns. ? ?If a referral was placed today, you will be contacted for an appointment. Please note that routine referrals can sometimes take up to 3-4 weeks to process. Please call our office if you haven't heard anything after this time frame. ? ?Take care, ? ?Vance Peper, NP ? ?

## 2022-01-15 ENCOUNTER — Other Ambulatory Visit (HOSPITAL_COMMUNITY): Payer: Self-pay

## 2022-01-15 ENCOUNTER — Other Ambulatory Visit: Payer: Self-pay

## 2022-01-15 MED ORDER — LEVOCETIRIZINE DIHYDROCHLORIDE 5 MG PO TABS
5.0000 mg | ORAL_TABLET | Freq: Every evening | ORAL | 3 refills | Status: DC
  Filled 2022-01-15 – 2022-01-18 (×2): qty 90, 90d supply, fill #0
  Filled 2022-03-28: qty 30, 30d supply, fill #1
  Filled 2022-04-02 – 2022-04-30 (×2): qty 30, 30d supply, fill #0

## 2022-01-18 ENCOUNTER — Other Ambulatory Visit (HOSPITAL_COMMUNITY): Payer: Self-pay

## 2022-01-18 ENCOUNTER — Other Ambulatory Visit (HOSPITAL_BASED_OUTPATIENT_CLINIC_OR_DEPARTMENT_OTHER): Payer: Self-pay

## 2022-03-28 ENCOUNTER — Other Ambulatory Visit (HOSPITAL_BASED_OUTPATIENT_CLINIC_OR_DEPARTMENT_OTHER): Payer: Self-pay

## 2022-03-28 ENCOUNTER — Other Ambulatory Visit: Payer: Self-pay | Admitting: Nurse Practitioner

## 2022-03-29 ENCOUNTER — Other Ambulatory Visit (HOSPITAL_BASED_OUTPATIENT_CLINIC_OR_DEPARTMENT_OTHER): Payer: Self-pay

## 2022-03-29 MED ORDER — VALSARTAN-HYDROCHLOROTHIAZIDE 160-25 MG PO TABS
1.0000 | ORAL_TABLET | Freq: Every day | ORAL | 0 refills | Status: DC
  Filled 2022-03-29: qty 90, 90d supply, fill #0

## 2022-03-29 MED ORDER — MONTELUKAST SODIUM 10 MG PO TABS
10.0000 mg | ORAL_TABLET | Freq: Every day | ORAL | 3 refills | Status: DC
  Filled 2022-03-29 – 2022-04-30 (×2): qty 30, 30d supply, fill #0

## 2022-03-29 NOTE — Telephone Encounter (Signed)
Chart Supports Rx Last OV: 12/2021 Next OV not scheduled

## 2022-03-30 ENCOUNTER — Other Ambulatory Visit (HOSPITAL_BASED_OUTPATIENT_CLINIC_OR_DEPARTMENT_OTHER): Payer: Self-pay

## 2022-04-02 ENCOUNTER — Other Ambulatory Visit (HOSPITAL_BASED_OUTPATIENT_CLINIC_OR_DEPARTMENT_OTHER): Payer: Self-pay

## 2022-04-12 ENCOUNTER — Other Ambulatory Visit (HOSPITAL_BASED_OUTPATIENT_CLINIC_OR_DEPARTMENT_OTHER): Payer: Self-pay

## 2022-04-30 ENCOUNTER — Other Ambulatory Visit (HOSPITAL_BASED_OUTPATIENT_CLINIC_OR_DEPARTMENT_OTHER): Payer: Self-pay

## 2022-04-30 ENCOUNTER — Other Ambulatory Visit (HOSPITAL_COMMUNITY): Payer: Self-pay

## 2022-05-01 ENCOUNTER — Other Ambulatory Visit (HOSPITAL_COMMUNITY): Payer: Self-pay

## 2022-06-13 ENCOUNTER — Other Ambulatory Visit (HOSPITAL_COMMUNITY): Payer: Self-pay

## 2022-06-15 ENCOUNTER — Other Ambulatory Visit (HOSPITAL_COMMUNITY): Payer: Self-pay

## 2022-07-27 ENCOUNTER — Other Ambulatory Visit: Payer: Self-pay | Admitting: Nurse Practitioner

## 2022-07-27 ENCOUNTER — Other Ambulatory Visit (HOSPITAL_BASED_OUTPATIENT_CLINIC_OR_DEPARTMENT_OTHER): Payer: Self-pay

## 2022-07-27 MED ORDER — LEVOCETIRIZINE DIHYDROCHLORIDE 5 MG PO TABS
5.0000 mg | ORAL_TABLET | Freq: Every evening | ORAL | 3 refills | Status: DC
  Filled 2022-07-27: qty 30, 30d supply, fill #0
  Filled 2023-01-17: qty 30, 30d supply, fill #1
  Filled 2023-03-08 – 2023-03-18 (×2): qty 30, 30d supply, fill #2
  Filled 2023-04-22 – 2023-05-13 (×2): qty 30, 30d supply, fill #3

## 2022-07-27 MED ORDER — VALSARTAN-HYDROCHLOROTHIAZIDE 160-25 MG PO TABS
1.0000 | ORAL_TABLET | Freq: Every day | ORAL | 0 refills | Status: DC
  Filled 2022-07-27: qty 90, 90d supply, fill #0

## 2022-07-31 ENCOUNTER — Other Ambulatory Visit (HOSPITAL_BASED_OUTPATIENT_CLINIC_OR_DEPARTMENT_OTHER): Payer: Self-pay

## 2022-07-31 MED ORDER — INFLUENZA VAC SPLIT QUAD 0.5 ML IM SUSY
PREFILLED_SYRINGE | INTRAMUSCULAR | 0 refills | Status: DC
  Filled 2022-07-31: qty 0.5, 1d supply, fill #0

## 2022-08-27 ENCOUNTER — Ambulatory Visit
Admission: EM | Admit: 2022-08-27 | Discharge: 2022-08-27 | Disposition: A | Discharge status: Home / Self Care | Payer: PRIVATE HEALTH INSURANCE | Attending: Physician Assistant | Admitting: Physician Assistant

## 2022-08-27 ENCOUNTER — Other Ambulatory Visit (HOSPITAL_COMMUNITY): Payer: Self-pay

## 2022-08-27 DIAGNOSIS — M25551 Pain in right hip: Secondary | ICD-10-CM

## 2022-08-27 MED ORDER — MONTELUKAST SODIUM 10 MG PO TABS
10.0000 mg | ORAL_TABLET | Freq: Every evening | ORAL | 5 refills | Status: DC
  Filled 2022-08-27: qty 30, 30d supply, fill #0
  Filled 2023-01-17: qty 30, 30d supply, fill #1
  Filled 2023-03-08 – 2023-03-18 (×2): qty 30, 30d supply, fill #2
  Filled 2023-04-22: qty 30, 30d supply, fill #3
  Filled 2023-05-28: qty 30, 30d supply, fill #4

## 2022-08-27 MED ORDER — LEVOCETIRIZINE DIHYDROCHLORIDE 5 MG PO TABS
10.0000 mg | ORAL_TABLET | Freq: Every evening | ORAL | 6 refills | Status: DC
  Filled 2022-08-27: qty 60, 30d supply, fill #0

## 2022-08-27 MED ORDER — PREDNISONE 20 MG PO TABS
40.0000 mg | ORAL_TABLET | Freq: Every day | ORAL | 0 refills | Status: AC
  Filled 2022-08-27: qty 10, 5d supply, fill #0

## 2022-08-27 MED ORDER — OLOPATADINE HCL 0.2 % OP SOLN
1.0000 [drp] | Freq: Every day | OPHTHALMIC | 5 refills | Status: DC
  Filled 2022-08-27: qty 2.5, 30d supply, fill #0
  Filled 2023-02-05: qty 2.5, 30d supply, fill #1

## 2022-08-27 MED ORDER — FLUTICASONE FUROATE-VILANTEROL 200-25 MCG/ACT IN AEPB
1.0000 | INHALATION_SPRAY | Freq: Every day | RESPIRATORY_TRACT | 5 refills | Status: DC
  Filled 2022-08-27: qty 60, 30d supply, fill #0

## 2022-08-27 MED ORDER — METHYLPREDNISOLONE ACETATE 40 MG/ML IJ SUSP
40.0000 mg | Freq: Once | INTRAMUSCULAR | Status: AC
  Administered 2022-08-27: 40 mg via INTRAMUSCULAR

## 2022-08-27 MED ORDER — IPRATROPIUM BROMIDE 0.06 % NA SOLN
2.0000 | Freq: Three times a day (TID) | NASAL | 2 refills | Status: DC
  Filled 2022-08-27: qty 45, 75d supply, fill #0

## 2022-08-27 MED ORDER — ALBUTEROL SULFATE HFA 108 (90 BASE) MCG/ACT IN AERS
1.0000 | INHALATION_SPRAY | RESPIRATORY_TRACT | 0 refills | Status: DC | PRN
  Filled 2022-08-27: qty 6.7, 17d supply, fill #0

## 2022-08-27 MED ORDER — FLUTICASONE PROPIONATE 50 MCG/ACT NA SUSP
1.0000 | Freq: Every day | NASAL | 2 refills | Status: DC
  Filled 2022-08-27: qty 16, 30d supply, fill #0
  Filled 2023-03-08 – 2023-03-18 (×2): qty 16, 30d supply, fill #1

## 2022-08-27 NOTE — ED Triage Notes (Signed)
Pt c/o right hip pain x 2 days states she just woke up and it was painful denies known injury/trauma.

## 2022-08-27 NOTE — ED Provider Notes (Signed)
[name]    CSN: [id number] Arrival date & time: 08/27/22  1304      History   Chief Complaint Chief Complaint  Patient presents with   right hip pain    HPI Stacy Berry is a 51 y.o. female.   Patient here today for evaluation of right hip pain that started yesterday with waking.  She reports that she has pain with walking as well as lying on her side.  She states that pain is present to the right lateral hip area and does radiate down her leg at times.  She has not had any improvement with over-the-counter medication.  She denies any injury.  The history is provided by the patient.    Past Medical History:  Diagnosis Date   Allergy    seasonal allergies   Ectopic pregnancy    Fibroid    History of trichomoniasis 03/03/2020   [id number].  Treated.   Hyperlipidemia    Hypertension    Infection    UTI   Lipoma     Patient Active Problem List   Diagnosis Date Noted   History of abnormal cervical Pap smear 12/04/2021   Hyperlipidemia 12/04/2021   Chest pressure 12/04/2021   Mass of breast at 6 o'clock position 12/04/2021   Eczema 12/04/2021   Seasonal allergies 12/04/2021   Essential hypertension 03/04/2018   Lipoma of abdominal wall 09/03/2012    Past Surgical History:  Procedure Laterality Date   ECTOPIC PREGNANCY SURGERY  1990   ENDOMETRIAL ABLATION     GYNECOLOGIC CRYOSURGERY     MASS EXCISION Right 06/14/2020   Procedure: EXCISION OF SOFT TISSUE MASS RIGHT FLANK;  Surgeon: Jesusita Oka, MD;  Location: [facility];  Service: General;  Laterality: Right;   [place] Right 06/15/2013   Procedure: ROBOTIC ASSISTED TOTAL HYSTERECTOMY AND RIGHT SALINGECTOMY;  Surgeon: Lahoma Crocker, MD;  Location: [facility];  Service: Gynecology;  Laterality: Right;    OB History     Gravida  5   Para  4   Term  4   Preterm      AB  1   Living  4      SAB      IAB      Ectopic  1    Multiple      Live Births  4            Home Medications    Prior to Admission medications   Medication Sig Start Date End Date Taking? Authorizing Provider  predniSONE (DELTASONE) 20 MG tablet Take 2 tablets (40 mg total) by mouth daily with breakfast for 5 days. 08/27/22 09/01/22 Yes Francene Finders, PA-C  albuterol (VENTOLIN HFA) 108 (90 Base) MCG/ACT inhaler Inhale 1-2 puffs into the lungs every 4 to 6 hours as needed for cough /wheeze Patient not taking: Reported on 12/04/2021 06/28/21     albuterol (VENTOLIN HFA) 108 (90 Base) MCG/ACT inhaler Inhale 1-2 puffs into the lungs every 4-6 hours as needed for cough/wheeze 08/27/22     clotrimazole-betamethasone (LOTRISONE) cream Apply 1 application topically daily. 12/04/21   McElwee, Lauren A, NP  fluticasone (FLONASE) 50 MCG/ACT nasal spray Place 1-2 sprays into both nostrils daily. 08/27/22     fluticasone furoate-vilanterol (BREO ELLIPTA) 200-25 MCG/ACT AEPB Inhale 1 puff into the lungs daily. 08/27/22     influenza vac split quadrivalent PF (FLUARIX) 0.5 ML injection Inject into the muscle.  07/31/22     ipratropium (ATROVENT) 0.06 % nasal spray Place 2 sprays into both nostrils 3 (three) times daily. 08/27/22     levocetirizine (XYZAL) 5 MG tablet TAKE 1 TABLET BY MOUTH ONCE DAILY 12/19/20 12/19/21  Mosetta Anis, MD  levocetirizine (XYZAL) 5 MG tablet Take 1 tablet (5 mg total) by mouth every evening. 07/27/22     levocetirizine (XYZAL) 5 MG tablet Take 2 tablets (10 mg total) by mouth every evening. 08/27/22     montelukast (SINGULAIR) 10 MG tablet Take 1 tablet (10 mg total) by mouth daily. 03/29/22     montelukast (SINGULAIR) 10 MG tablet Take 1 tablet (10 mg total) by mouth at bedtime. 08/27/22     Olopatadine HCl (PATADAY) 0.2 % SOLN Instill 1 into affected eye drop daily. 08/27/22     triamcinolone cream (KENALOG) 0.1 % Apply 1 application topically 2 (two) times daily. Patient not taking: Reported on 12/04/2021 04/12/21    Eulas Post, MD  valsartan-hydrochlorothiazide (DIOVAN-HCT) 160-25 MG tablet Take 1 tablet by mouth daily. 07/27/22   McElwee, Lauren A, NP  amLODipine (NORVASC) 5 MG tablet TAKE 1 TABLET BY MOUTH DAILY. **DUE FOR YEARLY PHYSICAL** 11/01/20 12/08/20  Dorothyann Peng, NP    Family History Family History  Problem Relation Age of Onset   Breast cancer Mother    Prostate cancer Father    Cancer Paternal Grandfather    Breast cancer Maternal Grandmother    Diabetes Maternal Grandfather    Breast cancer Sister    Colon cancer Neg Hx    Colon polyps Neg Hx    Esophageal cancer Neg Hx    Rectal cancer Neg Hx    Stomach cancer Neg Hx     Social History Social History   Tobacco Use   Smoking status: Former    Packs/day: 0.01    Years: 10.00    Total pack years: 0.10    Types: Cigarettes   Smokeless tobacco: Never  Vaping Use   Vaping Use: Never used  Substance Use Topics   Alcohol use: Yes    Comment: occasional   Drug use: No     Allergies   Shellfish allergy and Cocoa   Review of Systems Review of Systems  Constitutional:  Negative for chills and fever.  Eyes:  Negative for discharge and redness.  Respiratory:  Negative for shortness of breath.   Gastrointestinal:  Negative for abdominal pain, nausea and vomiting.  Genitourinary:  Positive for vaginal bleeding and vaginal discharge.  Musculoskeletal:  Positive for arthralgias.     Physical Exam Triage Vital Signs ED Triage Vitals [08/27/22 1450]  Enc Vitals Group     BP 120/70     Pulse Rate 97     Resp 16     Temp 98 F (36.7 C)     Temp Source Oral     SpO2 97 %     Weight      Height      Head Circumference      Peak Flow      Pain Score 10     Pain Loc      Pain Edu?      Excl. in [place]?    No data found.  Updated Vital Signs BP 120/70 (BP Location: Right Arm)   Pulse 97   Temp 98 F (36.7 C) (Oral)   Resp 16   LMP 05/29/2013   SpO2 97%      Physical Exam Vitals and nursing  note  reviewed.  Constitutional:      General: She is not in acute distress.    Appearance: Normal appearance. She is not ill-appearing.  HENT:     Head: Normocephalic and atraumatic.  Eyes:     Conjunctiva/sclera: Conjunctivae normal.  Cardiovascular:     Rate and Rhythm: Normal rate.  Pulmonary:     Effort: Pulmonary effort is normal.  Musculoskeletal:     Comments: Significant tenderness to palpation to lateral right hip, patient able to bear weight but does have antalgic gait.  Neurological:     Mental Status: She is alert.  Psychiatric:        Mood and Affect: Mood normal.        Behavior: Behavior normal.        Thought Content: Thought content normal.      UC Treatments / Results  Labs (all labs ordered are listed, but only abnormal results are displayed) Labs Reviewed - No data to display  EKG   Radiology No results found.  Procedures Procedures (including critical care time)  Medications Ordered in UC Medications  methylPREDNISolone acetate (DEPO-MEDROL) injection 40 mg (40 mg Intramuscular Given 08/27/22 1519)    Initial Impression / Assessment and Plan / UC Course  I have reviewed the triage vital signs and the nursing notes.  Pertinent labs & imaging results that were available during my care of the patient were reviewed by me and considered in my medical decision making (see chart for details).    Given presentation and location of tenderness to palpation on exam suspect likely bursitis.  Will treat with steroid burst to start tomorrow after steroid injection in office today and recommended further evaluation by Ortho if symptoms do not improve.  Patient expresses understanding.  Final Clinical Impressions(s) / UC Diagnoses   Final diagnoses:  Right hip pain   Discharge Instructions   None    ED Prescriptions     Medication Sig Dispense Auth. Provider   predniSONE (DELTASONE) 20 MG tablet Take 2 tablets (40 mg total) by mouth daily with breakfast  for 5 days. 10 tablet Francene Finders, PA-C      PDMP not reviewed this encounter.   Francene Finders, PA-C 08/27/22 [phone]

## 2022-08-28 ENCOUNTER — Telehealth: Payer: Self-pay

## 2022-08-28 ENCOUNTER — Other Ambulatory Visit (HOSPITAL_COMMUNITY): Payer: Self-pay

## 2022-11-02 ENCOUNTER — Other Ambulatory Visit (HOSPITAL_COMMUNITY): Payer: Self-pay

## 2022-11-08 ENCOUNTER — Encounter (HOSPITAL_BASED_OUTPATIENT_CLINIC_OR_DEPARTMENT_OTHER): Payer: Self-pay | Admitting: Pharmacist

## 2022-11-08 ENCOUNTER — Other Ambulatory Visit (HOSPITAL_BASED_OUTPATIENT_CLINIC_OR_DEPARTMENT_OTHER): Payer: Self-pay

## 2022-11-08 ENCOUNTER — Other Ambulatory Visit: Payer: Self-pay | Admitting: Nurse Practitioner

## 2023-01-17 ENCOUNTER — Other Ambulatory Visit (HOSPITAL_COMMUNITY): Payer: Self-pay

## 2023-01-17 ENCOUNTER — Other Ambulatory Visit: Payer: Self-pay

## 2023-01-31 DIAGNOSIS — M7590 Shoulder lesion, unspecified, unspecified shoulder: Secondary | ICD-10-CM | POA: Insufficient documentation

## 2023-01-31 DIAGNOSIS — M7550 Bursitis of unspecified shoulder: Secondary | ICD-10-CM

## 2023-01-31 HISTORY — DX: Bursitis of unspecified shoulder: M75.50

## 2023-02-02 DIAGNOSIS — T7840XA Allergy, unspecified, initial encounter: Secondary | ICD-10-CM | POA: Insufficient documentation

## 2023-02-02 HISTORY — DX: Allergy, unspecified, initial encounter: T78.40XA

## 2023-02-05 ENCOUNTER — Other Ambulatory Visit (HOSPITAL_COMMUNITY): Payer: Self-pay

## 2023-02-06 ENCOUNTER — Other Ambulatory Visit (HOSPITAL_COMMUNITY): Payer: Self-pay

## 2023-02-06 ENCOUNTER — Ambulatory Visit
Admission: EM | Admit: 2023-02-06 | Discharge: 2023-02-06 | Disposition: A | Discharge status: Home / Self Care | Payer: No Typology Code available for payment source | Attending: Emergency Medicine | Admitting: Emergency Medicine

## 2023-02-06 DIAGNOSIS — I10 Essential (primary) hypertension: Secondary | ICD-10-CM | POA: Diagnosis not present

## 2023-02-06 DIAGNOSIS — L03213 Periorbital cellulitis: Secondary | ICD-10-CM

## 2023-02-06 MED ORDER — AMOXICILLIN-POT CLAVULANATE 875-125 MG PO TABS
1.0000 | ORAL_TABLET | Freq: Two times a day (BID) | ORAL | 0 refills | Status: DC
  Filled 2023-02-06: qty 14, 7d supply, fill #0

## 2023-02-06 MED ORDER — VALSARTAN-HYDROCHLOROTHIAZIDE 160-25 MG PO TABS
1.0000 | ORAL_TABLET | Freq: Every day | ORAL | 0 refills | Status: DC
  Filled 2023-02-06: qty 30, 30d supply, fill #0
  Filled 2023-03-08 – 2023-03-18 (×2): qty 30, 30d supply, fill #1
  Filled 2023-04-22: qty 30, 30d supply, fill #2

## 2023-02-06 NOTE — ED Triage Notes (Signed)
Pt reports her left eye is dark red, has drainage, itches, and is painful x 4 days. Took pataday drops but no relief.

## 2023-02-06 NOTE — ED Provider Notes (Signed)
[name]    CSN: [id number] Arrival date & time: 02/06/23  4098      History   Chief Complaint No chief complaint on file.   HPI Stacy Berry is a 52 y.o. female.   Patient presents for evaluation of severe erythema, pain and visual disturbance present to the left eye for 3 days.  Known exposure to pinkeye from family member.  Typically takes Pataday eyedrops for allergies which has been minimally effective.  Denies injury or trauma to the eye     Past Medical History:  Diagnosis Date   Allergy    seasonal allergies   Ectopic pregnancy    Fibroid    History of trichomoniasis 03/03/2020   2820221.  Treated.   Hyperlipidemia    Hypertension    Infection    UTI   Lipoma     Patient Active Problem List   Diagnosis Date Noted   History of abnormal cervical Pap smear 12/04/2021   Hyperlipidemia 12/04/2021   Chest pressure 12/04/2021   Mass of breast at 6 o'clock position 12/04/2021   Eczema 12/04/2021   Seasonal allergies 12/04/2021   Essential hypertension 03/04/2018   Lipoma of abdominal wall 09/03/2012    Past Surgical History:  Procedure Laterality Date   ECTOPIC PREGNANCY SURGERY  1990   ENDOMETRIAL ABLATION     GYNECOLOGIC CRYOSURGERY     MASS EXCISION Right 06/14/2020   Procedure: EXCISION OF SOFT TISSUE MASS RIGHT FLANK;  Surgeon: Diamantina Monks, MD;  Location: [facility];  Service: General;  Laterality: Right;   NSVD  1992. 1995. 1998. 2000   ROBOTIC ASSISTED TOTAL HYSTERECTOMY Right 06/15/2013   Procedure: ROBOTIC ASSISTED TOTAL HYSTERECTOMY AND RIGHT SALINGECTOMY;  Surgeon: Antionette Char, MD;  Location: [facility];  Service: Gynecology;  Laterality: Right;    OB History     Gravida  5   Para  4   Term  4   Preterm      AB  1   Living  4      SAB      IAB      Ectopic  1   Multiple      Live Births  4            Home Medications    Prior to Admission medications   Medication Sig Start Date End Date Taking?  Authorizing Provider  albuterol (VENTOLIN HFA) 108 (90 Base) MCG/ACT inhaler Inhale 1-2 puffs into the lungs every 4 to 6 hours as needed for cough /wheeze Patient not taking: Reported on 12/04/2021 06/28/21     albuterol (VENTOLIN HFA) 108 (90 Base) MCG/ACT inhaler Inhale 1-2 puffs into the lungs every 4-6 hours as needed for cough/wheeze 08/27/22     clotrimazole-betamethasone (LOTRISONE) cream Apply 1 application topically daily. 12/04/21   McElwee, Lauren A, NP  fluticasone (FLONASE) 50 MCG/ACT nasal spray Place 1-2 sprays into both nostrils daily. 08/27/22     fluticasone furoate-vilanterol (BREO ELLIPTA) 200-25 MCG/ACT AEPB Inhale 1 puff into the lungs daily. 08/27/22     influenza vac split quadrivalent PF (FLUARIX) 0.5 ML injection Inject into the muscle. 07/31/22     ipratropium (ATROVENT) 0.06 % nasal spray Place 2 sprays into both nostrils 3 (three) times daily. 08/27/22     levocetirizine (XYZAL) 5 MG tablet TAKE 1 TABLET BY MOUTH ONCE DAILY 12/19/20 12/19/21  Sidney Ace, MD  levocetirizine (XYZAL) 5 MG tablet Take 1 tablet (5 mg total) by mouth every evening.  07/27/22     levocetirizine (XYZAL) 5 MG tablet Take 2 tablets (10 mg total) by mouth every evening. 08/27/22     montelukast (SINGULAIR) 10 MG tablet Take 1 tablet (10 mg total) by mouth daily. 03/29/22     montelukast (SINGULAIR) 10 MG tablet Take 1 tablet (10 mg total) by mouth at bedtime. 08/27/22     Olopatadine HCl (PATADAY) 0.2 % SOLN Instill 1 into affected eye drop daily. 08/27/22     triamcinolone cream (KENALOG) 0.1 % Apply 1 application topically 2 (two) times daily. Patient not taking: Reported on 12/04/2021 04/12/21   Kristian Covey, MD  valsartan-hydrochlorothiazide (DIOVAN-HCT) 160-25 MG tablet Take 1 tablet by mouth daily. 07/27/22   McElwee, Lauren A, NP  amLODipine (NORVASC) 5 MG tablet TAKE 1 TABLET BY MOUTH DAILY. **DUE FOR YEARLY PHYSICAL** 11/01/20 12/08/20  Shirline Frees, NP    Family History Family  History  Problem Relation Age of Onset   Breast cancer Mother    Prostate cancer Father    Cancer Paternal Grandfather    Breast cancer Maternal Grandmother    Diabetes Maternal Grandfather    Breast cancer Sister    Colon cancer Neg Hx    Colon polyps Neg Hx    Esophageal cancer Neg Hx    Rectal cancer Neg Hx    Stomach cancer Neg Hx     Social History Social History   Tobacco Use   Smoking status: Former    Packs/day: 0.01    Years: 10.00    Additional pack years: 0.00    Total pack years: 0.10    Types: Cigarettes   Smokeless tobacco: Never  Vaping Use   Vaping Use: Never used  Substance Use Topics   Alcohol use: Yes    Comment: occasional   Drug use: No     Allergies   Shellfish allergy and Cocoa   Review of Systems Review of Systems  Constitutional: Negative.   HENT: Negative.    Eyes:  Positive for pain, discharge, redness and visual disturbance. Negative for photophobia and itching.  Respiratory: Negative.    Cardiovascular: Negative.      Physical Exam Triage Vital Signs ED Triage Vitals  Enc Vitals Group     BP 02/06/23 0920 (!) 174/131     Pulse Rate 02/06/23 0920 90     Resp 02/06/23 0920 18     Temp 02/06/23 0920 97.7 F (36.5 C)     Temp Source 02/06/23 0920 Oral     SpO2 02/06/23 0920 98 %     Weight --      Height --      Head Circumference --      Peak Flow --      Pain Score 02/06/23 0922 8     Pain Loc --      Pain Edu? --      Excl. in [place]? --    No data found.  Updated Vital Signs BP (!) 174/131 (BP Location: Right Arm)   Pulse 90   Temp 97.7 F (36.5 C) (Oral)   Resp 18   LMP 05/29/2013   SpO2 98%   Visual Acuity Right Eye Distance:   Left Eye Distance:   Bilateral Distance:    Right Eye Near:   Left Eye Near:    Bilateral Near:     Physical Exam Constitutional:      Appearance: Normal appearance.  Eyes:     Extraocular Movements: Extraocular movements intact.  Comments: Severe erythema present to the  left eye covering the conjunctivitis and the skin layer, vision is grossly intact, extraocular movements intact, mild swelling to the periorbital region, no drainage on exam  Cardiovascular:     Rate and Rhythm: Normal rate and regular rhythm.     Pulses: Normal pulses.     Heart sounds: Normal heart sounds.  Pulmonary:     Effort: Pulmonary effort is normal.     Breath sounds: Normal breath sounds.  Neurological:     Mental Status: She is alert and oriented to person, place, and time. Mental status is at baseline.      UC Treatments / Results  Labs (all labs ordered are listed, but only abnormal results are displayed) Labs Reviewed - No data to display  EKG   Radiology No results found.  Procedures Procedures (including critical care time)  Medications Ordered in UC Medications - No data to display  Initial Impression / Assessment and Plan / UC Course  I have reviewed the triage vital signs and the nursing notes.  Pertinent labs & imaging results that were available during my care of the patient were reviewed by me and considered in my medical decision making (see chart for details).  Periorbital cellulitis of the left eye, essential hypertension  Presenting condition is consistent with severe conjunctivitis and mild cellulitis prescribing oral antibiotics, Augmentin sent to pharmacy, advised against eye tugging or rubbing, recommended over-the-counter analgesics and antihistamines and warm to cool compresses for management of discomfort, demonstrate precautions no improvement seen to follow-up with urgent care  Blood pressure in triage 174/131, endorses she is in the process of changing primary doctors and ran out of medication 1 to 2 weeks ago, prior to this had been taking daily as prescribed, no signs of hypertensive urgency, 90-day supply sent to pharmacy and PCP referral placed Final Clinical Impressions(s) / UC Diagnoses   Final diagnoses:  None   Discharge  Instructions   None    ED Prescriptions   None    PDMP not reviewed this encounter.   Valinda Hoar, NP 02/06/23 1031

## 2023-02-06 NOTE — Discharge Instructions (Signed)
You are being treated for cellulitis of the eye which is an factious process of the skin and Viabahn  Take Augmentin every morning and every evening for 7 days  May use cool compress for comfort and to remove discharge if present. Pat the eye, do not wipe.  If wearing contacts, dispose of current pair. Wear glasses until symptoms have resolved.   Do not rub eyes, this may cause more irritation.  May use benadryl as needed to help if itching present.  Please avoid use of eye makeup until symptoms clear.  If symptoms persist after use of medication, please follow up at Urgent Care or with ophthalmologist (eye doctor)    Pressure medication has been refilled for 90 days, during this time please work diligently to find a doctor to continue prescribing her medication, and primary care referral has also been placed within our system to help you

## 2023-02-07 ENCOUNTER — Other Ambulatory Visit (HOSPITAL_COMMUNITY)
Admission: RE | Admit: 2023-02-07 | Discharge: 2023-02-07 | Disposition: A | Discharge status: Home / Self Care | Payer: PRIVATE HEALTH INSURANCE | Source: Ambulatory Visit | Attending: Obstetrics and Gynecology | Admitting: Obstetrics and Gynecology

## 2023-02-07 ENCOUNTER — Encounter: Payer: Self-pay | Admitting: Obstetrics and Gynecology

## 2023-02-07 ENCOUNTER — Other Ambulatory Visit: Payer: Self-pay

## 2023-02-07 ENCOUNTER — Ambulatory Visit: Payer: PRIVATE HEALTH INSURANCE | Admitting: Obstetrics and Gynecology

## 2023-02-07 VITALS — BP 149/108 | HR 93 | Wt 209.9 lb

## 2023-02-07 DIAGNOSIS — Z113 Encounter for screening for infections with a predominantly sexual mode of transmission: Secondary | ICD-10-CM

## 2023-02-07 DIAGNOSIS — Z01419 Encounter for gynecological examination (general) (routine) without abnormal findings: Secondary | ICD-10-CM | POA: Diagnosis not present

## 2023-02-07 DIAGNOSIS — Z124 Encounter for screening for malignant neoplasm of cervix: Secondary | ICD-10-CM

## 2023-02-07 DIAGNOSIS — R052 Subacute cough: Secondary | ICD-10-CM

## 2023-02-07 HISTORY — DX: Subacute cough: R05.2

## 2023-02-07 NOTE — Progress Notes (Signed)
ANNUAL EXAM Patient name: Stacy Berry MRN [id number]  Date of birth: 1971-01-05 Chief Complaint:   Gynecologic Exam  History of Present Illness:   Stacy Berry is a 52 y.o. [id number] being seen today for a routine annual exam.  Current complaints: annual, pink eye   Breast or nipple changes? No  Sexually active? Yes , no issues  Diagnosed with pink eye yesterday, still having some discomfort  Hx of total hysterectomy in 2014. Denies needing any cervical procedures for abnormal pap smears in the past  Patient's last menstrual period was 05/29/2013.   Last pap     Component Value Date/Time   DIAGPAP  12/01/2019 1613    - Negative for intraepithelial lesion or malignancy (NILM)   HPVHIGH Negative 12/01/2019 1613   ADEQPAP Satisfactory for evaluation. 12/01/2019 1613   Last mammogram: 05/2021 [facility] 3 Last colonoscopy: 03/2021     12/04/2021    3:46 PM 12/08/2020    7:50 AM  Depression screen PHQ 2/9  Decreased Interest 0 0  Down, Depressed, Hopeless 0 0  PHQ - 2 Score 0 0  Altered sleeping 1 0  Tired, decreased energy 0 0  Change in appetite 0 0  Feeling bad or failure about yourself  0 0  Trouble concentrating 1 0  Moving slowly or fidgety/restless 0 0  Suicidal thoughts 0 0  PHQ-9 Score 2 0  Difficult doing work/chores Somewhat difficult Not difficult at all        12/04/2021    3:46 PM  GAD 7 : Generalized Anxiety Score  Nervous, Anxious, on Edge 0  Control/stop worrying 0  Worry too much - different things 0  Trouble relaxing 0  Restless 0  Easily annoyed or irritable 0  Afraid - awful might happen 0  Total GAD 7 Score 0  Anxiety Difficulty Not difficult at all     Review of Systems:   Pertinent items are noted in HPI Denies any headaches, blurred vision, fatigue, shortness of breath, chest pain, abdominal pain, abnormal vaginal discharge/itching/odor/irritation, problems with periods, bowel movements, urination, or intercourse unless otherwise  stated above. Pertinent History Reviewed:  Reviewed past medical,surgical, social and family history.  Reviewed problem list, medications and allergies. Physical Assessment:   Vitals:   02/07/23 1403  BP: (!) 149/108  Pulse: 93  Weight: 209 lb 14.4 oz (95.2 kg)  Body mass index is 34.93 kg/m.        Physical Examination:   General appearance - well appearing, and in no distress  Mental status - alert, oriented to person, place, and time  Psych:  She has a normal mood and affect  Skin - warm and dry, normal color, no suspicious lesions noted  Chest - effort normal, all lung fields clear to auscultation bilaterally  Heart - normal rate and regular rhythm  Breasts - breasts appear normal, no suspicious masses, no skin or nipple changes or  axillary nodes  Abdomen - soft, nontender, nondistended, no masses or organomegaly  Pelvic -  VULVA: normal appearing vulva with no masses, tenderness or lesions   VAGINA: normal appearing vagina with normal color and discharge, no lesions    Extremities:  No swelling or varicosities noted  Chaperone present for exam  No results found for this or any previous visit (from the past 24 hour(s)).    Assessment & Plan:  1. Well woman exam with routine gynecological exam 2. Screening for cervical cancer - Cervical cancer screening: not indicate - Breast  Health: Encouraged self breast awareness/SBE. Discussed limits of clinical breast exam for detecting breast cancer. Discussed importance of annual MXR.  Mammo to be scheduled - Climacteric/Sexual health: Reviewed typical and atypical symptoms of menopause/peri-menopause. Discussed PMB and to call if any amount of spotting.  - Colonoscopy: up to date - F/U 12 months and prn  3. Routine screening for STI (sexually transmitted infection) Labs collected   Follow up with PCP or urgent care re: pink eye    Orders Placed This Encounter  Procedures   MM 3D SCREENING MAMMOGRAM BILATERAL BREAST    RPR+HBsAg+HCVAb+...    Meds: No orders of the defined types were placed in this encounter.   Follow-up: Return for Annual GYN.  Lorriane Shire, MD 02/07/2023 2:10 PM

## 2023-02-08 LAB — CERVICOVAGINAL ANCILLARY ONLY
Chlamydia: NEGATIVE
Comment: NEGATIVE
Comment: NEGATIVE
Comment: NORMAL
Neisseria Gonorrhea: NEGATIVE
Trichomonas: NEGATIVE

## 2023-02-08 LAB — RPR+HBSAG+HCVAB+...
HIV Screen 4th Generation wRfx: NONREACTIVE
Hep C Virus Ab: NONREACTIVE
Hepatitis B Surface Ag: NEGATIVE
RPR Ser Ql: NONREACTIVE

## 2023-03-08 ENCOUNTER — Ambulatory Visit (HOSPITAL_BASED_OUTPATIENT_CLINIC_OR_DEPARTMENT_OTHER)
Admission: RE | Admit: 2023-03-08 | Discharge: 2023-03-08 | Disposition: A | Discharge status: Home / Self Care | Payer: PRIVATE HEALTH INSURANCE | Source: Ambulatory Visit | Attending: Obstetrics and Gynecology | Admitting: Obstetrics and Gynecology

## 2023-03-08 ENCOUNTER — Other Ambulatory Visit (HOSPITAL_BASED_OUTPATIENT_CLINIC_OR_DEPARTMENT_OTHER): Payer: Self-pay

## 2023-03-08 DIAGNOSIS — Z01419 Encounter for gynecological examination (general) (routine) without abnormal findings: Secondary | ICD-10-CM | POA: Diagnosis present

## 2023-03-08 DIAGNOSIS — Z1231 Encounter for screening mammogram for malignant neoplasm of breast: Secondary | ICD-10-CM | POA: Diagnosis not present

## 2023-03-12 ENCOUNTER — Other Ambulatory Visit: Payer: Self-pay | Admitting: Obstetrics and Gynecology

## 2023-03-12 DIAGNOSIS — R928 Other abnormal and inconclusive findings on diagnostic imaging of breast: Secondary | ICD-10-CM

## 2023-03-18 ENCOUNTER — Other Ambulatory Visit (HOSPITAL_BASED_OUTPATIENT_CLINIC_OR_DEPARTMENT_OTHER): Payer: Self-pay

## 2023-03-18 ENCOUNTER — Other Ambulatory Visit (HOSPITAL_COMMUNITY): Payer: Self-pay

## 2023-03-19 ENCOUNTER — Other Ambulatory Visit (HOSPITAL_COMMUNITY): Payer: Self-pay

## 2023-03-25 ENCOUNTER — Ambulatory Visit
Admission: RE | Admit: 2023-03-25 | Discharge: 2023-03-25 | Disposition: A | Discharge status: Home / Self Care | Payer: PRIVATE HEALTH INSURANCE | Source: Ambulatory Visit | Attending: Obstetrics and Gynecology

## 2023-03-25 DIAGNOSIS — R928 Other abnormal and inconclusive findings on diagnostic imaging of breast: Secondary | ICD-10-CM

## 2023-04-22 ENCOUNTER — Other Ambulatory Visit (HOSPITAL_COMMUNITY): Payer: Self-pay

## 2023-04-23 ENCOUNTER — Other Ambulatory Visit (HOSPITAL_COMMUNITY): Payer: Self-pay

## 2023-05-13 ENCOUNTER — Other Ambulatory Visit (HOSPITAL_BASED_OUTPATIENT_CLINIC_OR_DEPARTMENT_OTHER): Payer: Self-pay

## 2023-05-28 ENCOUNTER — Other Ambulatory Visit (HOSPITAL_COMMUNITY): Payer: Self-pay

## 2023-05-28 ENCOUNTER — Other Ambulatory Visit (HOSPITAL_COMMUNITY): Payer: Self-pay | Admitting: Emergency Medicine

## 2023-05-29 ENCOUNTER — Other Ambulatory Visit (HOSPITAL_COMMUNITY): Payer: Self-pay

## 2023-05-31 ENCOUNTER — Other Ambulatory Visit: Payer: Self-pay | Admitting: Medical Genetics

## 2023-05-31 DIAGNOSIS — Z006 Encounter for examination for normal comparison and control in clinical research program: Secondary | ICD-10-CM

## 2023-06-06 ENCOUNTER — Other Ambulatory Visit (HOSPITAL_COMMUNITY): Payer: Self-pay

## 2023-06-06 ENCOUNTER — Other Ambulatory Visit: Payer: Self-pay | Admitting: Nurse Practitioner

## 2023-06-06 NOTE — Telephone Encounter (Signed)
Called pt lvm top call back.

## 2023-06-07 ENCOUNTER — Other Ambulatory Visit: Payer: Self-pay | Admitting: Nurse Practitioner

## 2023-06-07 ENCOUNTER — Other Ambulatory Visit (HOSPITAL_COMMUNITY): Payer: Self-pay

## 2023-06-07 MED ORDER — VALSARTAN-HYDROCHLOROTHIAZIDE 160-25 MG PO TABS
1.0000 | ORAL_TABLET | Freq: Every day | ORAL | 0 refills | Status: DC
  Filled 2023-06-07 (×2): qty 30, 30d supply, fill #0

## 2023-06-07 NOTE — Telephone Encounter (Signed)
I called and spok with patient and notified her that she will need an appointment and she said that one was made for 06/21/23. She would like a refill till her appointment.

## 2023-06-11 NOTE — Telephone Encounter (Signed)
Patient notified that a 30 day supply was sent in.

## 2023-06-12 ENCOUNTER — Ambulatory Visit: Payer: PRIVATE HEALTH INSURANCE | Admitting: Nurse Practitioner

## 2023-06-21 ENCOUNTER — Other Ambulatory Visit (HOSPITAL_COMMUNITY): Payer: Self-pay

## 2023-06-21 ENCOUNTER — Other Ambulatory Visit: Payer: Self-pay

## 2023-06-21 ENCOUNTER — Ambulatory Visit (INDEPENDENT_AMBULATORY_CARE_PROVIDER_SITE_OTHER): Payer: PRIVATE HEALTH INSURANCE | Admitting: Nurse Practitioner

## 2023-06-21 VITALS — BP 124/80 | HR 96 | Temp 97.7°F | Ht 65.0 in | Wt 209.4 lb

## 2023-06-21 DIAGNOSIS — E782 Mixed hyperlipidemia: Secondary | ICD-10-CM | POA: Diagnosis not present

## 2023-06-21 DIAGNOSIS — Z0000 Encounter for general adult medical examination without abnormal findings: Secondary | ICD-10-CM | POA: Diagnosis not present

## 2023-06-21 DIAGNOSIS — J302 Other seasonal allergic rhinitis: Secondary | ICD-10-CM

## 2023-06-21 DIAGNOSIS — I10 Essential (primary) hypertension: Secondary | ICD-10-CM

## 2023-06-21 LAB — CBC WITH DIFFERENTIAL/PLATELET
Basophils Absolute: 0 10*3/uL (ref 0.0–0.1)
Basophils Relative: 0.4 % (ref 0.0–3.0)
Eosinophils Absolute: 0.2 10*3/uL (ref 0.0–0.7)
Eosinophils Relative: 3 % (ref 0.0–5.0)
HCT: 39.7 % (ref 36.0–46.0)
Hemoglobin: 13.1 g/dL (ref 12.0–15.0)
Lymphocytes Relative: 42 % (ref 12.0–46.0)
Lymphs Abs: 3.3 10*3/uL (ref 0.7–4.0)
MCHC: 33 g/dL (ref 30.0–36.0)
MCV: 97.9 fl (ref 78.0–100.0)
Monocytes Absolute: 0.8 10*3/uL (ref 0.1–1.0)
Monocytes Relative: 9.7 % (ref 3.0–12.0)
Neutro Abs: 3.5 10*3/uL (ref 1.4–7.7)
Neutrophils Relative %: 44.9 % (ref 43.0–77.0)
Platelets: 388 10*3/uL (ref 150.0–400.0)
RBC: 4.05 Mil/uL (ref 3.87–5.11)
RDW: 14.7 % (ref 11.5–15.5)
WBC: 7.9 10*3/uL (ref 4.0–10.5)

## 2023-06-21 LAB — COMPREHENSIVE METABOLIC PANEL
ALT: 11 U/L (ref 0–35)
AST: 12 U/L (ref 0–37)
Albumin: 4.1 g/dL (ref 3.5–5.2)
Alkaline Phosphatase: 94 U/L (ref 39–117)
BUN: 13 mg/dL (ref 6–23)
CO2: 31 meq/L (ref 19–32)
Calcium: 9.3 mg/dL (ref 8.4–10.5)
Chloride: 101 meq/L (ref 96–112)
Creatinine, Ser: 0.88 mg/dL (ref 0.40–1.20)
GFR: 74.7 mL/min (ref >60.00)
Glucose, Bld: 80 mg/dL (ref 70–99)
Potassium: 3.7 meq/L (ref 3.5–5.1)
Sodium: 139 meq/L (ref 135–145)
Total Bilirubin: 0.3 mg/dL (ref 0.2–1.2)
Total Protein: 6.7 g/dL (ref 6.0–8.3)

## 2023-06-21 LAB — LIPID PANEL
Cholesterol: 233 mg/dL — ABNORMAL HIGH (ref 0–200)
HDL: 48.7 mg/dL (ref >39.00)
LDL Cholesterol: 133 mg/dL — ABNORMAL HIGH (ref 0–99)
NonHDL: 184.45
Total CHOL/HDL Ratio: 5
Triglycerides: 259 mg/dL — ABNORMAL HIGH (ref 0.0–149.0)
VLDL: 51.8 mg/dL — ABNORMAL HIGH (ref 0.0–40.0)

## 2023-06-21 MED ORDER — MONTELUKAST SODIUM 10 MG PO TABS
10.0000 mg | ORAL_TABLET | Freq: Every evening | ORAL | 3 refills | Status: DC
  Filled 2023-06-21: qty 90, 90d supply, fill #0

## 2023-06-21 MED ORDER — LEVOCETIRIZINE DIHYDROCHLORIDE 5 MG PO TABS
10.0000 mg | ORAL_TABLET | Freq: Every evening | ORAL | 3 refills | Status: DC
  Filled 2023-06-21: qty 60, 30d supply, fill #0

## 2023-06-21 MED ORDER — VALSARTAN-HYDROCHLOROTHIAZIDE 160-25 MG PO TABS
1.0000 | ORAL_TABLET | Freq: Every day | ORAL | 3 refills | Status: DC
  Filled 2023-06-21: qty 90, 90d supply, fill #0
  Filled 2023-07-02: qty 30, 30d supply, fill #0

## 2023-06-21 NOTE — Progress Notes (Signed)
BP 124/80 (BP Location: Right Arm)   Pulse 96   Temp 97.7 F (36.5 C)   Ht 5\' 5"  (1.651 m)   Wt 209 lb 6.4 oz (95 kg)   LMP 05/29/2013   SpO2 97%   BMI 34.85 kg/m    Subjective:    Patient ID: Stacy Berry, female    DOB: 11/13/1970, 52 y.o.   MRN: [id number]  CC: Chief Complaint  Patient presents with   Annual Exam    HPI: Stacy Berry is a 52 y.o. female presenting on 06/21/2023 for comprehensive medical examination. Current medical complaints include:none  Depression and Anxiety Screen done today and results listed below:     02/07/2023    2:53 PM 12/04/2021    3:46 PM 12/08/2020    7:50 AM  Depression screen PHQ 2/9  Decreased Interest 0 0 0  Down, Depressed, Hopeless 0 0 0  PHQ - 2 Score 0 0 0  Altered sleeping 0 1 0  Tired, decreased energy 0 0 0  Change in appetite 0 0 0  Feeling bad or failure about yourself  0 0 0  Trouble concentrating 0 1 0  Moving slowly or fidgety/restless 0 0 0  Suicidal thoughts 0 0 0  PHQ-9 Score 0 2 0  Difficult doing work/chores  Somewhat difficult Not difficult at all      02/07/2023    2:53 PM 12/04/2021    3:46 PM  GAD 7 : Generalized Anxiety Score  Nervous, Anxious, on Edge 0 0  Control/stop worrying 0 0  Worry too much - different things 0 0  Trouble relaxing 1 0  Restless 0 0  Easily annoyed or irritable 0 0  Afraid - awful might happen 0 0  Total GAD 7 Score 1 0  Anxiety Difficulty  Not difficult at all    The patient does not have a history of falls. I did not complete a risk assessment for falls. A plan of care for falls was not documented.   Past Medical History:  Past Medical History:  Diagnosis Date   Allergies 02/02/2023   Allergy    seasonal allergies   Bursitis and tendinitis of shoulder region 01/31/2023   Ectopic pregnancy    Fibroid    History of trichomoniasis 03/03/2020   [id number].  Treated.   Hyperlipidemia    Hypertension    Infection    UTI   Laryngopharyngeal reflux 10/03/2020    Lipoma    Neck pain 10/03/2020   Pain in left knee 12/29/2019   Subacute cough 02/07/2023   Throat pain 10/03/2020   Thyroid nodule 10/03/2020    Surgical History:  Past Surgical History:  Procedure Laterality Date   ECTOPIC PREGNANCY SURGERY  1990   ENDOMETRIAL ABLATION     GYNECOLOGIC CRYOSURGERY     MASS EXCISION Right 06/14/2020   Procedure: EXCISION OF SOFT TISSUE MASS RIGHT FLANK;  Surgeon: Diamantina Monks, MD;  Location: [facility] OR;  Service: General;  Laterality: Right;   NSVD  1992. 1995. 1998. 2000   ROBOTIC ASSISTED TOTAL HYSTERECTOMY Right 06/15/2013   Procedure: ROBOTIC ASSISTED TOTAL HYSTERECTOMY AND RIGHT SALINGECTOMY;  Surgeon: Antionette Char, MD;  Location: [facility];  Service: Gynecology;  Laterality: Right;    Medications:  Current Outpatient Medications on File Prior to Visit  Medication Sig   albuterol (VENTOLIN HFA) 108 (90 Base) MCG/ACT inhaler Inhale 1-2 puffs into the lungs every 4-6 hours as needed for cough/wheeze   clotrimazole-betamethasone (LOTRISONE)  cream Apply 1 application topically daily.   fluticasone (FLONASE) 50 MCG/ACT nasal spray Place 1-2 sprays into both nostrils daily.   fluticasone furoate-vilanterol (BREO ELLIPTA) 200-25 MCG/ACT AEPB Inhale 1 puff into the lungs daily.   Olopatadine HCl (PATADAY) 0.2 % SOLN Instill 1 into affected eye drop daily.   ipratropium (ATROVENT) 0.06 % nasal spray Place 2 sprays into both nostrils 3 (three) times daily.   levocetirizine (XYZAL) 5 MG tablet TAKE 1 TABLET BY MOUTH ONCE DAILY   [DISCONTINUED] amLODipine (NORVASC) 5 MG tablet TAKE 1 TABLET BY MOUTH DAILY. **DUE FOR YEARLY PHYSICAL**   Current Facility-Administered Medications on File Prior to Visit  Medication   0.9 %  sodium chloride infusion    Allergies:  Allergies  Allergen Reactions   Other Other (See Comments)    Allergic to cockroaches   Shellfish Allergy Anaphylaxis   Cocoa Hives    Social History:  Social History   Socioeconomic  History   Marital status: Single    Spouse name: Not on file   Number of children: Not on file   Years of education: Not on file   Highest education level: Associate degree: occupational, Scientist, product/process development, or vocational program  Occupational History   Not on file  Tobacco Use   Smoking status: Former    Current packs/day: 0.01    Average packs/day: (0.1 ttl pk-yrs)    Types: Cigarettes   Smokeless tobacco: Never  Vaping Use   Vaping status: Never Used  Substance and Sexual Activity   Alcohol use: Yes    Comment: occasional   Drug use: No   Sexual activity: Not Currently    Birth control/protection: Surgical    Comment: hysterectomy  Other Topics Concern   Not on file  Social History Narrative   Not on file   Social Determinants of Health   Financial Resource Strain: Medium Risk (06/21/2023)   Overall Financial Resource Strain (CARDIA)    Difficulty of Paying Living Expenses: Somewhat hard  Food Insecurity: Food Insecurity Present (06/21/2023)   Hunger Vital Sign    Worried About Running Out of Food in the Last Year: Sometimes true    Ran Out of Food in the Last Year: Sometimes true  Transportation Needs: No Transportation Needs (06/21/2023)   PRAPARE - Administrator, Civil Service (Medical): No    Lack of Transportation (Non-Medical): No  Physical Activity: Sufficiently Active (06/21/2023)   Exercise Vital Sign    Days of Exercise per Week: 4 days    Minutes of Exercise per Session: 60 min  Stress: Stress Concern Present (06/21/2023)   [employer] of Occupational Health - Occupational Stress Questionnaire    Feeling of Stress : To some extent  Social Connections: Moderately Isolated (06/21/2023)   Social Connection and Isolation Panel [NHANES]    Frequency of Communication with Friends and Family: More than three times a week    Frequency of Social Gatherings with Friends and Family: More than three times a week    Attends Religious Services: 1 to 4 times per  year    Active Member of [employer] or Organizations: No    Attends Banker Meetings: Not on file    Marital Status: Never married  Intimate Partner Violence: Unknown (01/10/2022)   Received from [employer], [facility]   HITS    Physically Hurt: Not on file    Insult or Talk Down To: Not on file    Threaten Physical Harm: Not on file  Scream or Curse: Not on file   Social History   Tobacco Use  Smoking Status Former   Current packs/day: 0.01   Average packs/day: (0.1 ttl pk-yrs)   Types: Cigarettes  Smokeless Tobacco Never   Social History   Substance and Sexual Activity  Alcohol Use Yes   Comment: occasional    Family History:  Family History  Problem Relation Age of Onset   Breast cancer Mother    Prostate cancer Father    Cancer Paternal Grandfather    Breast cancer Maternal Grandmother    Diabetes Maternal Grandfather    Breast cancer Sister    Colon cancer Neg Hx    Colon polyps Neg Hx    Esophageal cancer Neg Hx    Rectal cancer Neg Hx    Stomach cancer Neg Hx     Past medical history, surgical history, medications, allergies, family history and social history reviewed with patient today and changes made to appropriate areas of the chart.   Review of Systems  Constitutional: Negative.   HENT: Negative.    Eyes: Negative.   Respiratory: Negative.    Cardiovascular: Negative.   Gastrointestinal: Negative.   Genitourinary: Negative.   Musculoskeletal: Negative.   Skin: Negative.   Neurological: Negative.   Psychiatric/Behavioral: Negative.     All other ROS negative except what is listed above and in the HPI.      Objective:    BP 124/80 (BP Location: Right Arm)   Pulse 96   Temp 97.7 F (36.5 C)   Ht 5\' 5"  (1.651 m)   Wt 209 lb 6.4 oz (95 kg)   LMP 05/29/2013   SpO2 97%   BMI 34.85 kg/m   Wt Readings from Last 3 Encounters:  06/21/23 209 lb 6.4 oz (95 kg)  02/07/23 209 lb 14.4 oz (95.2 kg)  01/01/22 209 lb 12.8 oz (95.2  kg)    Physical Exam Vitals and nursing note reviewed.  Constitutional:      General: She is not in acute distress.    Appearance: Normal appearance.  HENT:     Head: Normocephalic and atraumatic.     Right Ear: Tympanic membrane, ear canal and external ear normal.     Left Ear: Tympanic membrane, ear canal and external ear normal.     Mouth/Throat:     Mouth: Mucous membranes are moist.     Pharynx: No posterior oropharyngeal erythema.  Eyes:     Conjunctiva/sclera: Conjunctivae normal.  Cardiovascular:     Rate and Rhythm: Normal rate and regular rhythm.     Pulses: Normal pulses.     Heart sounds: Normal heart sounds.  Pulmonary:     Effort: Pulmonary effort is normal.     Breath sounds: Normal breath sounds.  Abdominal:     Palpations: Abdomen is soft.     Tenderness: There is no abdominal tenderness.  Musculoskeletal:        General: Normal range of motion.     Cervical back: Normal range of motion and neck supple.     Right lower leg: No edema.     Left lower leg: No edema.  Lymphadenopathy:     Cervical: No cervical adenopathy.  Skin:    General: Skin is warm and dry.  Neurological:     General: No focal deficit present.     Mental Status: She is alert and oriented to person, place, and time.     Cranial Nerves: No cranial nerve deficit.  Coordination: Coordination normal.     Gait: Gait normal.  Psychiatric:        Mood and Affect: Mood normal.        Behavior: Behavior normal.        Thought Content: Thought content normal.        Judgment: Judgment normal.     Results for orders placed or performed in visit on 06/21/23  HM PAP SMEAR  Result Value Ref Range   HM Pap smear normal   Results Console HPV  Result Value Ref Range   CHL HPV Negative       Assessment & Plan:   Problem List Items Addressed This Visit       Cardiovascular and Mediastinum   Essential hypertension    Chronic, stable.  Continue valsartan-hydrochlorothiazide 160-25 mg  daily.  Check CMP, CBC, lipid panel today.  Follow-up in 1 year.      Relevant Medications   valsartan-hydrochlorothiazide (DIOVAN-HCT) 160-25 MG tablet   Other Relevant Orders   CBC with Differential/Platelet   Comprehensive metabolic panel   Lipid panel     Other   Hyperlipidemia    Chronic, stable.  Will check CMP, CBC, lipid panel today.  Continue focus on nutrition and exercise.      Relevant Medications   valsartan-hydrochlorothiazide (DIOVAN-HCT) 160-25 MG tablet   Other Relevant Orders   CBC with Differential/Platelet   Comprehensive metabolic panel   Lipid panel   Seasonal allergies    Chronic, stable.  Continue Xyzal 10 mg daily and Singulair 10 mg daily.      Routine general medical examination at a health care facility - Primary    Health maintenance reviewed and updated. Discussed nutrition, exercise. Check CMP, CBC today. Follow-up 1 year.          Follow up plan: Return in about 1 year (around 06/20/2024) for CPE.   LABORATORY TESTING:  - Pap smear: up to date  IMMUNIZATIONS:   - Tdap: Tetanus vaccination status reviewed: last tetanus booster within 10 years. - Influenza:  declined - Pneumovax: Not applicable - Prevnar: Not applicable - HPV: Not applicable - Shingrix vaccine:  declined  SCREENING: -Mammogram: Up to date  - Colonoscopy: Up to date  - Bone Density: Not applicable   PATIENT COUNSELING:   Advised to take 1 mg of folate supplement per day if capable of pregnancy.   Sexuality: Discussed sexually transmitted diseases, partner selection, use of condoms, avoidance of unintended pregnancy  and contraceptive alternatives.   Advised to avoid cigarette smoking.  I discussed with the patient that most people either abstain from alcohol or drink within safe limits (<=14/week and <=4 drinks/occasion for males, <=7/weeks and <= 3 drinks/occasion for females) and that the risk for alcohol disorders and other health effects rises proportionally  with the number of drinks per week and how often a drinker exceeds daily limits.  Discussed cessation/primary prevention of drug use and availability of treatment for abuse.   Diet: Encouraged to adjust caloric intake to maintain  or achieve ideal body weight, to reduce intake of dietary saturated fat and total fat, to limit sodium intake by avoiding high sodium foods and not adding table salt, and to maintain adequate dietary potassium and calcium preferably from fresh fruits, vegetables, and low-fat dairy products.    stressed the importance of regular exercise  Injury prevention: Discussed safety belts, safety helmets, smoke detector, smoking near bedding or upholstery.   Dental health: Discussed importance of regular tooth brushing,  flossing, and dental visits.    NEXT PREVENTATIVE PHYSICAL DUE IN 1 YEAR. Return in about 1 year (around 06/20/2024) for CPE.  Stacy Berry

## 2023-06-21 NOTE — Assessment & Plan Note (Signed)
Chronic, stable.  Continue Xyzal 10 mg daily and Singulair 10 mg daily.

## 2023-06-21 NOTE — Assessment & Plan Note (Signed)
Chronic, stable.  Will check CMP, CBC, lipid panel today.  Continue focus on nutrition and exercise.

## 2023-06-21 NOTE — Assessment & Plan Note (Signed)
Health maintenance reviewed and updated. Discussed nutrition, exercise. Check CMP, CBC today. Follow-up 1 year.

## 2023-06-21 NOTE — Assessment & Plan Note (Signed)
Chronic, stable.  Continue valsartan-hydrochlorothiazide 160-25 mg daily.  Check CMP, CBC, lipid panel today.  Follow-up in 1 year.

## 2023-06-21 NOTE — Patient Instructions (Signed)
It was great to see you!  We are checking your labs today and will let you know the results via mychart/phone.   I have refilled your medications.   Let's follow-up in 1 year, sooner if you have concerns.  If a referral was placed today, you will be contacted for an appointment. Please note that routine referrals can sometimes take up to 3-4 weeks to process. Please call our office if you haven't heard anything after this time frame.  Take care,  Rodman Pickle, NP

## 2023-07-02 ENCOUNTER — Other Ambulatory Visit (HOSPITAL_COMMUNITY): Payer: Self-pay

## 2023-07-08 ENCOUNTER — Other Ambulatory Visit (HOSPITAL_BASED_OUTPATIENT_CLINIC_OR_DEPARTMENT_OTHER): Payer: Self-pay

## 2023-07-08 MED ORDER — FLULAVAL 0.5 ML IM SUSY
0.5000 mL | PREFILLED_SYRINGE | Freq: Once | INTRAMUSCULAR | 0 refills | Status: AC
  Filled 2023-07-08: qty 0.5, 1d supply, fill #0

## 2023-08-01 ENCOUNTER — Other Ambulatory Visit (HOSPITAL_COMMUNITY): Payer: Self-pay

## 2023-08-01 MED ORDER — TIZANIDINE HCL 4 MG PO TABS
4.0000 mg | ORAL_TABLET | Freq: Four times a day (QID) | ORAL | 0 refills | Status: DC | PRN
  Filled 2023-08-01: qty 30, 8d supply, fill #0

## 2023-08-01 MED ORDER — VALSARTAN-HYDROCHLOROTHIAZIDE 160-25 MG PO TABS
1.0000 | ORAL_TABLET | Freq: Every day | ORAL | 0 refills | Status: DC
  Filled 2023-08-01: qty 90, 90d supply, fill #0

## 2023-08-01 MED ORDER — PREDNISONE 20 MG PO TABS
40.0000 mg | ORAL_TABLET | Freq: Every day | ORAL | 0 refills | Status: DC
  Filled 2023-08-01: qty 10, 5d supply, fill #0

## 2023-08-14 ENCOUNTER — Other Ambulatory Visit (HOSPITAL_COMMUNITY): Payer: Self-pay

## 2023-08-14 MED ORDER — DICLOFENAC SODIUM 75 MG PO TBEC
75.0000 mg | DELAYED_RELEASE_TABLET | Freq: Two times a day (BID) | ORAL | 0 refills | Status: DC
  Filled 2023-08-14: qty 60, 30d supply, fill #0

## 2023-08-26 ENCOUNTER — Other Ambulatory Visit (HOSPITAL_COMMUNITY): Payer: Self-pay

## 2023-08-26 MED ORDER — IPRATROPIUM BROMIDE 0.06 % NA SOLN
2.0000 | Freq: Three times a day (TID) | NASAL | 2 refills | Status: DC
  Filled 2023-08-26: qty 30, 28d supply, fill #0

## 2023-08-26 MED ORDER — ALBUTEROL SULFATE HFA 108 (90 BASE) MCG/ACT IN AERS
1.0000 | INHALATION_SPRAY | RESPIRATORY_TRACT | 0 refills | Status: DC
  Filled 2023-08-26: qty 6.7, 17d supply, fill #0
  Filled 2024-08-12: qty 6.7, 16d supply, fill #0

## 2023-08-26 MED ORDER — FLUTICASONE FUROATE-VILANTEROL 200-25 MCG/ACT IN AEPB
1.0000 | INHALATION_SPRAY | Freq: Every day | RESPIRATORY_TRACT | 5 refills | Status: DC
  Filled 2023-08-26: qty 60, 30d supply, fill #0

## 2023-09-03 DIAGNOSIS — M7061 Trochanteric bursitis, right hip: Secondary | ICD-10-CM | POA: Insufficient documentation

## 2023-09-06 ENCOUNTER — Other Ambulatory Visit (HOSPITAL_COMMUNITY): Payer: Self-pay

## 2024-03-10 ENCOUNTER — Other Ambulatory Visit: Payer: Self-pay

## 2024-03-10 ENCOUNTER — Emergency Department (HOSPITAL_BASED_OUTPATIENT_CLINIC_OR_DEPARTMENT_OTHER): Admitting: Radiology

## 2024-03-10 ENCOUNTER — Encounter (HOSPITAL_BASED_OUTPATIENT_CLINIC_OR_DEPARTMENT_OTHER): Payer: Self-pay

## 2024-03-10 ENCOUNTER — Emergency Department (HOSPITAL_BASED_OUTPATIENT_CLINIC_OR_DEPARTMENT_OTHER)
Admission: EM | Admit: 2024-03-10 | Discharge: 2024-03-10 | Disposition: A | Discharge status: Home / Self Care | Attending: Emergency Medicine | Admitting: Emergency Medicine

## 2024-03-10 DIAGNOSIS — M25561 Pain in right knee: Secondary | ICD-10-CM

## 2024-03-10 DIAGNOSIS — Y92410 Unspecified street and highway as the place of occurrence of the external cause: Secondary | ICD-10-CM | POA: Diagnosis not present

## 2024-03-10 DIAGNOSIS — T148XXA Other injury of unspecified body region, initial encounter: Secondary | ICD-10-CM

## 2024-03-10 DIAGNOSIS — S90821A Blister (nonthermal), right foot, initial encounter: Secondary | ICD-10-CM | POA: Diagnosis not present

## 2024-03-10 DIAGNOSIS — I10 Essential (primary) hypertension: Secondary | ICD-10-CM | POA: Insufficient documentation

## 2024-03-10 MED ORDER — VALSARTAN-HYDROCHLOROTHIAZIDE 160-25 MG PO TABS
1.0000 | ORAL_TABLET | Freq: Every day | ORAL | 3 refills | Status: DC
  Filled 2024-03-10: qty 90, 90d supply, fill #0

## 2024-03-10 MED ORDER — METHOCARBAMOL 500 MG PO TABS
500.0000 mg | ORAL_TABLET | Freq: Once | ORAL | Status: AC
  Administered 2024-03-10: 500 mg via ORAL
  Filled 2024-03-10: qty 1

## 2024-03-10 MED ORDER — METHOCARBAMOL 500 MG PO TABS
500.0000 mg | ORAL_TABLET | Freq: Two times a day (BID) | ORAL | 0 refills | Status: DC
  Filled 2024-03-10: qty 20, 10d supply, fill #0
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
## 2024-03-10 NOTE — Discharge Instructions (Addendum)

## 2024-03-11 ENCOUNTER — Other Ambulatory Visit (HOSPITAL_COMMUNITY): Payer: Self-pay

## 2024-03-16 ENCOUNTER — Encounter (HOSPITAL_BASED_OUTPATIENT_CLINIC_OR_DEPARTMENT_OTHER): Payer: Self-pay | Admitting: Student

## 2024-03-16 ENCOUNTER — Other Ambulatory Visit: Payer: Self-pay

## 2024-03-16 ENCOUNTER — Other Ambulatory Visit (HOSPITAL_COMMUNITY): Payer: Self-pay

## 2024-03-16 ENCOUNTER — Ambulatory Visit (HOSPITAL_BASED_OUTPATIENT_CLINIC_OR_DEPARTMENT_OTHER): Admitting: Student

## 2024-03-16 ENCOUNTER — Other Ambulatory Visit (HOSPITAL_BASED_OUTPATIENT_CLINIC_OR_DEPARTMENT_OTHER): Payer: Self-pay

## 2024-03-16 ENCOUNTER — Encounter: Payer: Self-pay | Admitting: Nurse Practitioner

## 2024-03-16 ENCOUNTER — Ambulatory Visit (INDEPENDENT_AMBULATORY_CARE_PROVIDER_SITE_OTHER): Payer: Self-pay | Admitting: Nurse Practitioner

## 2024-03-16 VITALS — BP 120/72 | HR 74 | Temp 96.3°F | Ht 64.0 in | Wt 200.4 lb

## 2024-03-16 DIAGNOSIS — M25561 Pain in right knee: Secondary | ICD-10-CM | POA: Diagnosis not present

## 2024-03-16 DIAGNOSIS — M79632 Pain in left forearm: Secondary | ICD-10-CM | POA: Insufficient documentation

## 2024-03-16 DIAGNOSIS — T300 Burn of unspecified body region, unspecified degree: Secondary | ICD-10-CM

## 2024-03-16 MED ORDER — LEVOCETIRIZINE DIHYDROCHLORIDE 5 MG PO TABS
10.0000 mg | ORAL_TABLET | Freq: Every evening | ORAL | 3 refills | Status: AC
  Filled 2024-03-16 – 2024-04-08 (×2): qty 60, 30d supply, fill #0
  Filled 2024-07-16: qty 60, 30d supply, fill #1
  Filled 2024-08-12: qty 60, 30d supply, fill #2

## 2024-03-16 MED ORDER — LIDOCAINE HCL 1 % IJ SOLN
4.0000 mL | INTRAMUSCULAR | Status: AC | PRN
Start: 2024-03-16 — End: 2024-03-16
  Administered 2024-03-16: 4 mL

## 2024-03-16 MED ORDER — MONTELUKAST SODIUM 10 MG PO TABS
10.0000 mg | ORAL_TABLET | Freq: Every evening | ORAL | 3 refills | Status: AC
  Filled 2024-03-16: qty 90, 90d supply, fill #0
  Filled 2024-07-16: qty 90, 90d supply, fill #1
  Filled 2024-08-12: qty 90, 90d supply, fill #2

## 2024-03-16 MED ORDER — TRAMADOL HCL 50 MG PO TABS
50.0000 mg | ORAL_TABLET | Freq: Three times a day (TID) | ORAL | 0 refills | Status: AC | PRN
  Filled 2024-03-16 (×2): qty 15, 5d supply, fill #0

## 2024-03-16 MED ORDER — TRIAMCINOLONE ACETONIDE 40 MG/ML IJ SUSP
2.0000 mL | INTRAMUSCULAR | Status: AC | PRN
  Administered 2024-03-16: 2 mL via INTRA_ARTICULAR
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
## 2024-03-16 NOTE — Patient Instructions (Signed)
 It was great to see you!  Start wearing a compression sleeve to your right knee  Use ice 2-3 times per day for 10 minutes  Keep alternating tylenol  and ibuprofen   Keep your left arm clean and covered   I have placed a referral to ortho  Let's follow-up if your symptoms worsen or don't improve   Take care,  Rheba Cedar, NP
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
## 2024-03-20 ENCOUNTER — Telehealth (HOSPITAL_BASED_OUTPATIENT_CLINIC_OR_DEPARTMENT_OTHER): Payer: Self-pay

## 2024-03-20 ENCOUNTER — Other Ambulatory Visit (HOSPITAL_BASED_OUTPATIENT_CLINIC_OR_DEPARTMENT_OTHER): Payer: Self-pay

## 2024-03-20 ENCOUNTER — Other Ambulatory Visit: Payer: Self-pay | Admitting: Nurse Practitioner

## 2024-03-20 ENCOUNTER — Telehealth (HOSPITAL_BASED_OUTPATIENT_CLINIC_OR_DEPARTMENT_OTHER): Payer: Self-pay | Admitting: Student

## 2024-03-20 DIAGNOSIS — M25561 Pain in right knee: Secondary | ICD-10-CM

## 2024-03-20 DIAGNOSIS — Z1231 Encounter for screening mammogram for malignant neoplasm of breast: Secondary | ICD-10-CM

## 2024-03-20 NOTE — Telephone Encounter (Signed)
 Patient needs her MRI referral sent to [facility] this where her insurance will pay for it

## 2024-03-20 NOTE — Telephone Encounter (Signed)
 Sent the MRI to [facility]. Also notified patient through MyChart

## 2024-03-23 ENCOUNTER — Telehealth: Payer: Self-pay | Admitting: Nurse Practitioner

## 2024-03-23 NOTE — Telephone Encounter (Signed)
 Copied from CRM [id number]. Topic: Referral - Request for Referral >> Mar 20, 2024  2:47 PM Albertha Alosa wrote: Did the patient discuss referral with their provider in the last year? Yes (If No - schedule appointment) (If Yes - send message)  Appointment offered? Yes  Type of order/referral and detailed reason for visit: Neurologist   Preference of office, provider, location: No Preference   If referral order, have you been seen by this specialty before? No (If Yes, this issue or another issue? When? Where?  Can we respond through MyChart? Yes

## 2024-03-27 ENCOUNTER — Other Ambulatory Visit (HOSPITAL_BASED_OUTPATIENT_CLINIC_OR_DEPARTMENT_OTHER): Payer: Self-pay

## 2024-03-27 LAB — HM MAMMOGRAPHY

## 2024-03-31 ENCOUNTER — Telehealth (HOSPITAL_BASED_OUTPATIENT_CLINIC_OR_DEPARTMENT_OTHER): Payer: Self-pay | Admitting: Student

## 2024-03-31 ENCOUNTER — Encounter: Payer: Self-pay | Admitting: Nurse Practitioner

## 2024-03-31 NOTE — Telephone Encounter (Signed)
 Patient wants order sent to [facility] for her Mri this message was sent last week. Please contact patient [phone]

## 2024-03-31 NOTE — Telephone Encounter (Signed)
 Called patient. Didn't answer left voicemail that stated the order for the MRI was sent to [facility]. And that the status says appt pending and they will give her call for appt.

## 2024-04-01 ENCOUNTER — Ambulatory Visit: Payer: Self-pay | Admitting: Nurse Practitioner

## 2024-04-01 ENCOUNTER — Telehealth: Payer: Self-pay

## 2024-04-01 DIAGNOSIS — G8929 Other chronic pain: Secondary | ICD-10-CM

## 2024-04-01 NOTE — Telephone Encounter (Unsigned)
 Copied from CRM [id number]. Topic: Referral - Request for Referral >> Mar 20, 2024  2:47 PM Stacy Berry wrote: Did the patient discuss referral with their provider in the last year? Yes (If No - schedule appointment) (If Yes - send message)  Appointment offered? Yes  Type of order/referral and detailed reason for visit: Neurologist   Preference of office, provider, location: No Preference   If referral order, have you been seen by this specialty before? No (If Yes, this issue or another issue? When? Where?  Can we respond through MyChart? Yes >> Mar 31, 2024 12:35 PM Stacy Berry wrote: Patient calling back to let Stacy Berry know that the reason she would like to see a neurologist due to consistent headaches and migraines. She said that she mentioned that issue at her previous appointment on 03/16/24 and does not believe another appointment is necessary. Please call patient, she prefers a phone call for communication not MyChart.

## 2024-04-02 NOTE — Telephone Encounter (Signed)
 I called and spoke with patient and notified of message and she would like to schedule an appointment due to headaches. Apppointment made for 04/03/24 at 820am.

## 2024-04-03 ENCOUNTER — Encounter: Payer: Self-pay | Admitting: Nurse Practitioner

## 2024-04-03 ENCOUNTER — Other Ambulatory Visit (HOSPITAL_COMMUNITY): Payer: Self-pay

## 2024-04-03 ENCOUNTER — Ambulatory Visit (INDEPENDENT_AMBULATORY_CARE_PROVIDER_SITE_OTHER): Admitting: Nurse Practitioner

## 2024-04-03 VITALS — BP 160/104 | HR 75 | Temp 97.0°F | Ht 64.0 in | Wt 199.0 lb

## 2024-04-03 DIAGNOSIS — M7989 Other specified soft tissue disorders: Secondary | ICD-10-CM | POA: Insufficient documentation

## 2024-04-03 DIAGNOSIS — R519 Headache, unspecified: Secondary | ICD-10-CM | POA: Diagnosis not present

## 2024-04-03 DIAGNOSIS — I10 Essential (primary) hypertension: Secondary | ICD-10-CM

## 2024-04-03 MED ORDER — KETOROLAC TROMETHAMINE 60 MG/2ML IM SOLN
30.0000 mg | Freq: Once | INTRAMUSCULAR | Status: AC
  Administered 2024-04-03: 30 mg via INTRAMUSCULAR

## 2024-04-03 MED ORDER — NORTRIPTYLINE HCL 10 MG PO CAPS
10.0000 mg | ORAL_CAPSULE | Freq: Every day | ORAL | 1 refills | Status: DC
  Filled 2024-04-03: qty 30, 30d supply, fill #0
  Filled 2024-05-02: qty 30, 30d supply, fill #1

## 2024-04-03 MED ORDER — AMLODIPINE BESYLATE 5 MG PO TABS
5.0000 mg | ORAL_TABLET | Freq: Every day | ORAL | 1 refills | Status: DC
Start: 2024-04-03 — End: 2024-05-27
  Filled 2024-04-03: qty 30, 30d supply, fill #0
  Filled 2024-05-02: qty 30, 30d supply, fill #1

## 2024-04-03 NOTE — Assessment & Plan Note (Signed)
 Chronic, not controlled. Her blood pressure is elevated at 160/104 mmHg. She stopped taking her blood pressure medication 1 week ago because it was causing her to see shapes/images when she closed her eyes. Start amlodipine  5mg  daily. Encourage her to check her blood pressure at home. Follow-up in 2-3 weeks.

## 2024-04-03 NOTE — Patient Instructions (Signed)
 It was great to see you!  Start nortriptyline 1 capsule at bedtime to help prevent the headaches  Start amlodipine  1 tablet daily for your blood pressure  You can get naproxen twice a day as needed with food to help your headache when you have one   Start wearing an ace wrap around your arm during the day to help with swelling   Let's follow-up in 2-3 weeks, sooner if you have concerns.  If a referral was placed today, you will be contacted for an appointment. Please note that routine referrals can sometimes take up to 3-4 weeks to process. Please call our office if you haven't heard anything after this time frame.  Take care,  Tinnie Harada, NP
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
## 2024-04-08 ENCOUNTER — Other Ambulatory Visit (HOSPITAL_BASED_OUTPATIENT_CLINIC_OR_DEPARTMENT_OTHER): Payer: Self-pay

## 2024-04-30 ENCOUNTER — Ambulatory Visit: Payer: Self-pay

## 2024-04-30 NOTE — Telephone Encounter (Signed)
 FYI Only or Action Required?: Action required by provider: requesting ibuprofen  800 mg prescription due to knee pain.  Patient was last seen in primary care on 04/03/2024 by Nedra Tinnie LABOR, NP.  Called Nurse Triage reporting Knee Pain.  Symptoms began today.pain increased today throbbing in nature  Interventions attempted: OTC medications: has been taking 4-200 mg ibuprofen -patient is wanting to have 800 mg available to have have to take so many pills and Rest, hydration, or home remedies.  Symptoms are: unchanged.  Triage Disposition: See HCP Within 4 Hours (Or PCP Triage)-requesting prescription for ibuprofen  800 mg  Patient/caregiver understands and will follow disposition?: No, wishes to speak with PCP  Copied from CRM #[id number]. Topic: Clinical - Red Word Triage >> Apr 30, 2024  4:17 PM Viola F wrote: Red Word that prompted transfer to Nurse Triage: Patient having a lot of right knee pain, its swollen and she has an MRI schedule Monday -  would like ibuprofen  800mg  sent to pharmacy to hold her over until then. Reason for Disposition  [1] SEVERE pain (e.g., excruciating, unable to walk) AND [2] not improved after 2 hours of pain medicine  Answer Assessment - Initial Assessment Questions 1. LOCATION and RADIATION: Where is the pain located?      Right knee pain 2. QUALITY: What does the pain feel like?  (e.g., sharp, dull, aching, burning)     throbbing 3. SEVERITY: How bad is the pain? What does it keep you from doing?   (Scale 1-10; or mild, moderate, severe)     8 4. ONSET: When did the pain start? Does it come and go, or is it there all the time?     Pain increased to throbbing this morning 5. RECURRENT: Have you had this pain before? If Yes, ask: When, and what happened then?     yes 6. SETTING: Has there been any recent work, exercise or other activity that involved that part of the body?      no 7. AGGRAVATING FACTORS: What makes the knee pain  worse? (e.g., walking, climbing stairs, running)     Bending the knee 8. ASSOCIATED SYMPTOMS: Is there any swelling or redness of the knee?     swelling 9. OTHER SYMPTOMS: Do you have any other symptoms? (e.g., calf pain, chest pain, difficulty breathing, fever)     No  Patient calling with request for Ibuprofen  800 mg  for her knee pain. Patient reports she has been seen before for this pain and is scheduled for an MRI on Monday.  Protocols used: Knee Pain-A-AH

## 2024-05-01 MED ORDER — IBUPROFEN 800 MG PO TABS: 800.0000 mg | ORAL_TABLET | Freq: Three times a day (TID) | ORAL | 0 refills | Status: DC | PRN

## 2024-05-01 NOTE — Telephone Encounter (Signed)
 I called patient and no answer and voicemail was full. I will send message via Mychart.

## 2024-05-01 NOTE — Addendum Note (Signed)
 Addended by: Lyncoln Ledgerwood A on: 05/01/2024 12:05 PM   Modules accepted: Orders

## 2024-05-01 NOTE — Telephone Encounter (Signed)
 requesting ibuprofen  800 mg prescription

## 2024-05-05 ENCOUNTER — Other Ambulatory Visit (HOSPITAL_COMMUNITY): Payer: Self-pay

## 2024-05-05 ENCOUNTER — Other Ambulatory Visit: Payer: Self-pay | Admitting: Nurse Practitioner

## 2024-05-05 NOTE — Telephone Encounter (Signed)
 Copied from CRM [id number]. Topic: Clinical - Medication Refill >> May 05, 2024  5:15 PM Leah C wrote: Medication: ibuprofen  (ADVIL ) 800 MG tablet  Has the patient contacted their pharmacy? Yes. Pharmacy did not receive the refill. (Agent: If no, request that the patient contact the pharmacy for the refill. If patient does not wish to contact the pharmacy document the reason why and proceed with request.) (Agent: If yes, when and what did the pharmacy advise?)  This is the patient's preferred pharmacy:  [facility] [address] [place] [address] Phone: [phone] Fax: [phone]   Is this the correct pharmacy for this prescription? Yes If no, delete pharmacy and type the correct one.   Has the prescription been filled recently? No  Is the patient out of the medication? Yes  Has the patient been seen for an appointment in the last year OR does the patient have an upcoming appointment? Yes  Can we respond through MyChart? Yes  Agent: Please be advised that Rx refills may take up to 3 business days. We ask that you follow-up with your pharmacy.

## 2024-05-06 ENCOUNTER — Other Ambulatory Visit (HOSPITAL_COMMUNITY): Payer: Self-pay

## 2024-05-06 MED ORDER — IBUPROFEN 800 MG PO TABS
800.0000 mg | ORAL_TABLET | Freq: Three times a day (TID) | ORAL | 0 refills | Status: DC | PRN
  Filled 2024-05-06: qty 30, 10d supply, fill #0

## 2024-05-06 NOTE — Telephone Encounter (Signed)
Left message that Rx was resent to pharmacy.

## 2024-05-07 ENCOUNTER — Other Ambulatory Visit (HOSPITAL_COMMUNITY): Payer: Self-pay

## 2024-05-11 ENCOUNTER — Ambulatory Visit: Admitting: Nurse Practitioner

## 2024-05-11 ENCOUNTER — Encounter: Payer: Self-pay | Admitting: Nurse Practitioner

## 2024-05-22 ENCOUNTER — Ambulatory Visit: Admitting: Nurse Practitioner

## 2024-05-27 ENCOUNTER — Encounter: Payer: Self-pay | Admitting: Nurse Practitioner

## 2024-05-27 ENCOUNTER — Other Ambulatory Visit (HOSPITAL_COMMUNITY): Payer: Self-pay

## 2024-05-27 ENCOUNTER — Ambulatory Visit (INDEPENDENT_AMBULATORY_CARE_PROVIDER_SITE_OTHER): Admitting: Nurse Practitioner

## 2024-05-27 ENCOUNTER — Other Ambulatory Visit (HOSPITAL_BASED_OUTPATIENT_CLINIC_OR_DEPARTMENT_OTHER): Payer: Self-pay

## 2024-05-27 VITALS — BP 138/88 | HR 81 | Ht 64.0 in | Wt 202.0 lb

## 2024-05-27 DIAGNOSIS — M79642 Pain in left hand: Secondary | ICD-10-CM | POA: Diagnosis not present

## 2024-05-27 DIAGNOSIS — R519 Headache, unspecified: Secondary | ICD-10-CM | POA: Diagnosis not present

## 2024-05-27 DIAGNOSIS — I10 Essential (primary) hypertension: Secondary | ICD-10-CM | POA: Diagnosis not present

## 2024-05-27 MED ORDER — IBUPROFEN 800 MG PO TABS
800.0000 mg | ORAL_TABLET | Freq: Three times a day (TID) | ORAL | 0 refills | Status: DC | PRN
  Filled 2024-05-27 (×2): qty 30, 10d supply, fill #0

## 2024-05-27 MED ORDER — PREDNISONE 20 MG PO TABS
40.0000 mg | ORAL_TABLET | Freq: Every day | ORAL | 0 refills | Status: DC
  Filled 2024-05-27 (×2): qty 10, 5d supply, fill #0

## 2024-05-27 MED ORDER — AMLODIPINE BESYLATE 5 MG PO TABS
5.0000 mg | ORAL_TABLET | Freq: Every day | ORAL | 1 refills | Status: AC
  Filled 2024-05-27 (×2): qty 90, 90d supply, fill #0
  Filled 2024-08-12 (×2): qty 90, 90d supply, fill #1
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
## 2024-05-27 NOTE — Assessment & Plan Note (Signed)
 Headaches are improving. Continue nortiptyline 10mg  at bedtime.
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
## 2024-05-27 NOTE — Patient Instructions (Signed)
 It was great to see you!  Start prednisone  2 tablets in the morning with food   I have placed a referral to ortho   Start wearing a thumb spica brace to help with the pain and swelling  Let's follow-up in 1-2 months, sooner if you have concerns.  If a referral was placed today, you will be contacted for an appointment. Please note that routine referrals can sometimes take up to 3-4 weeks to process. Please call our office if you haven't heard anything after this time frame.  Take care,  Tinnie Harada, NP

## 2024-05-27 NOTE — Assessment & Plan Note (Signed)
 Chronic, stable. Hypertension is well-controlled with amlodipine  5mg  daily. Refill amlodipine  prescription.

## 2024-06-22 ENCOUNTER — Other Ambulatory Visit: Payer: Self-pay

## 2024-06-22 ENCOUNTER — Ambulatory Visit (INDEPENDENT_AMBULATORY_CARE_PROVIDER_SITE_OTHER): Admitting: Orthopedic Surgery

## 2024-06-22 DIAGNOSIS — M79642 Pain in left hand: Secondary | ICD-10-CM | POA: Diagnosis not present

## 2024-06-22 DIAGNOSIS — M654 Radial styloid tenosynovitis [de Quervain]: Secondary | ICD-10-CM
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
## 2024-07-09 ENCOUNTER — Other Ambulatory Visit (HOSPITAL_BASED_OUTPATIENT_CLINIC_OR_DEPARTMENT_OTHER): Payer: Self-pay

## 2024-07-09 ENCOUNTER — Other Ambulatory Visit: Payer: Self-pay

## 2024-07-09 MED ORDER — FLUZONE 0.5 ML IM SUSY
0.5000 mL | PREFILLED_SYRINGE | Freq: Once | INTRAMUSCULAR | 0 refills | Status: AC
  Filled 2024-07-09: qty 0.5, 1d supply, fill #0

## 2024-07-09 NOTE — Telephone Encounter (Signed)
 ERROR PLEASE DISREGARD

## 2024-07-13 ENCOUNTER — Telehealth: Payer: Self-pay | Admitting: Neurology

## 2024-07-13 NOTE — Telephone Encounter (Signed)
 Pt called to in regards to receive text to reschedule appt  , INFORMED pt that she will get a call back to reschedule appt

## 2024-07-14 NOTE — Telephone Encounter (Signed)
 Spoke with patient and rescheduled appt with Dr. Gregg for 08/26/24 at 9:45am

## 2024-07-16 ENCOUNTER — Other Ambulatory Visit (HOSPITAL_BASED_OUTPATIENT_CLINIC_OR_DEPARTMENT_OTHER): Payer: Self-pay

## 2024-08-05 ENCOUNTER — Other Ambulatory Visit: Payer: Self-pay | Admitting: Medical Genetics

## 2024-08-05 DIAGNOSIS — Z006 Encounter for examination for normal comparison and control in clinical research program: Secondary | ICD-10-CM

## 2024-08-10 ENCOUNTER — Encounter: Payer: Self-pay | Admitting: Radiology

## 2024-08-12 ENCOUNTER — Other Ambulatory Visit: Payer: Self-pay | Admitting: Nurse Practitioner

## 2024-08-12 ENCOUNTER — Other Ambulatory Visit: Payer: Self-pay

## 2024-08-12 ENCOUNTER — Other Ambulatory Visit (HOSPITAL_BASED_OUTPATIENT_CLINIC_OR_DEPARTMENT_OTHER): Payer: Self-pay

## 2024-08-12 ENCOUNTER — Other Ambulatory Visit (HOSPITAL_COMMUNITY): Payer: Self-pay

## 2024-08-12 MED ORDER — FLUTICASONE PROPIONATE 50 MCG/ACT NA SUSP
1.0000 | Freq: Every day | NASAL | 0 refills | Status: AC
  Filled 2024-08-12 (×2): qty 16, 30d supply, fill #0

## 2024-08-12 MED ORDER — IBUPROFEN 800 MG PO TABS
800.0000 mg | ORAL_TABLET | Freq: Three times a day (TID) | ORAL | 0 refills | Status: AC | PRN
  Filled 2024-08-12: qty 30, 10d supply, fill #0

## 2024-08-12 NOTE — Telephone Encounter (Signed)
 Requesting: IBUPROFEN  800 MG PO TABS Last Visit: 05/27/2024 Next Visit: Visit date not found Last Refill: 05/27/2024  Please Advise

## 2024-08-26 ENCOUNTER — Other Ambulatory Visit (HOSPITAL_BASED_OUTPATIENT_CLINIC_OR_DEPARTMENT_OTHER): Payer: Self-pay

## 2024-08-26 ENCOUNTER — Ambulatory Visit: Admitting: Neurology

## 2024-08-26 ENCOUNTER — Encounter: Payer: Self-pay | Admitting: Neurology

## 2024-08-26 VITALS — BP 135/97 | HR 78 | Ht 64.0 in | Wt 200.5 lb

## 2024-08-26 DIAGNOSIS — G8929 Other chronic pain: Secondary | ICD-10-CM | POA: Diagnosis not present

## 2024-08-26 DIAGNOSIS — R519 Headache, unspecified: Secondary | ICD-10-CM | POA: Diagnosis not present

## 2024-08-26 DIAGNOSIS — J4520 Mild intermittent asthma, uncomplicated: Secondary | ICD-10-CM | POA: Insufficient documentation

## 2024-08-26 DIAGNOSIS — J4530 Mild persistent asthma, uncomplicated: Secondary | ICD-10-CM | POA: Insufficient documentation

## 2024-08-26 MED ORDER — AMITRIPTYLINE HCL 25 MG PO TABS
25.0000 mg | ORAL_TABLET | Freq: Every day | ORAL | 0 refills | Status: DC
  Filled 2024-08-26: qty 30, 30d supply, fill #0

## 2024-08-26 MED ORDER — BUTALBITAL-APAP-CAFFEINE 50-325-40 MG PO TABS
1.0000 | ORAL_TABLET | Freq: Four times a day (QID) | ORAL | 0 refills | Status: AC | PRN
  Filled 2024-08-26: qty 10, 3d supply, fill #0

## 2024-08-26 NOTE — Patient Instructions (Signed)
 Continue current medications Start amitriptyline 25 mg nightly, half tablet nightly for 1 week, then increase to full tablet Continue with ibuprofen  as needed for headaches Use Fioricet for severe headaches Will obtain MRI brain to rule out other causes of headaches Please call for updates Follow-up in 6 months or sooner if worse
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
## 2024-09-01 ENCOUNTER — Telehealth: Payer: Self-pay | Admitting: Neurology

## 2024-09-01 NOTE — Telephone Encounter (Signed)
 [employer] medicaid shara: [id number] exp. 08/31/24-10/30/24 sent to [facility] [phone]

## 2024-09-04 ENCOUNTER — Other Ambulatory Visit (HOSPITAL_BASED_OUTPATIENT_CLINIC_OR_DEPARTMENT_OTHER): Payer: Self-pay

## 2024-10-21 ENCOUNTER — Telehealth: Payer: Self-pay | Admitting: Neurology

## 2024-10-21 NOTE — Telephone Encounter (Signed)
 Pt called to request  to get medication refill  amitriptyline  (ELAVIL ) 25 MG tablet  Pt medication is to be sent to  [facility] (Ph: [phone])

## 2024-10-22 ENCOUNTER — Other Ambulatory Visit: Payer: Self-pay

## 2024-10-22 ENCOUNTER — Other Ambulatory Visit (HOSPITAL_BASED_OUTPATIENT_CLINIC_OR_DEPARTMENT_OTHER): Payer: Self-pay

## 2024-10-22 MED ORDER — AMITRIPTYLINE HCL 25 MG PO TABS
25.0000 mg | ORAL_TABLET | Freq: Every day | ORAL | 0 refills | Status: DC
  Filled 2024-10-22: qty 30, 30d supply, fill #0

## 2024-10-22 MED ORDER — AMITRIPTYLINE HCL 25 MG PO TABS
25.0000 mg | ORAL_TABLET | Freq: Every day | ORAL | 0 refills | Status: AC
  Filled 2024-10-22: qty 30, 30d supply, fill #0

## 2024-11-20 ENCOUNTER — Other Ambulatory Visit
